# Patient Record
Sex: Female | Born: 1980 | Race: White | Hispanic: No | Marital: Married | Smoking: Never smoker
Health system: Southern US, Community
[De-identification: ages and names within clinical notes are randomized; demographics above are authoritative.]

## PROBLEM LIST (undated history)

## (undated) DIAGNOSIS — B019 Varicella without complication: Secondary | ICD-10-CM

## (undated) DIAGNOSIS — Z789 Other specified health status: Secondary | ICD-10-CM

## (undated) HISTORY — DX: Varicella without complication: B01.9

## (undated) HISTORY — PX: NO PAST SURGERIES: SHX2092

---

## 2020-04-08 DIAGNOSIS — Z03818 Encounter for observation for suspected exposure to other biological agents ruled out: Secondary | ICD-10-CM | POA: Diagnosis not present

## 2020-04-08 DIAGNOSIS — Z20828 Contact with and (suspected) exposure to other viral communicable diseases: Secondary | ICD-10-CM | POA: Diagnosis not present

## 2020-12-12 DIAGNOSIS — O009 Unspecified ectopic pregnancy without intrauterine pregnancy: Secondary | ICD-10-CM

## 2020-12-12 HISTORY — DX: Unspecified ectopic pregnancy without intrauterine pregnancy: O00.90

## 2021-05-30 ENCOUNTER — Emergency Department (HOSPITAL_COMMUNITY): Payer: BC Managed Care – PPO | Admitting: Anesthesiology

## 2021-05-30 ENCOUNTER — Ambulatory Visit (HOSPITAL_COMMUNITY)
Admission: EM | Admit: 2021-05-30 | Discharge: 2021-05-31 | Disposition: A | Discharge status: Home / Self Care | Payer: BC Managed Care – PPO | Attending: Obstetrics & Gynecology | Admitting: Obstetrics & Gynecology

## 2021-05-30 ENCOUNTER — Encounter (HOSPITAL_COMMUNITY)
Admission: EM | Disposition: A | Discharge status: Home / Self Care | Payer: Self-pay | Source: Home / Self Care | Attending: Emergency Medicine

## 2021-05-30 ENCOUNTER — Encounter (HOSPITAL_COMMUNITY): Payer: Self-pay

## 2021-05-30 ENCOUNTER — Other Ambulatory Visit: Payer: Self-pay

## 2021-05-30 DIAGNOSIS — Z8759 Personal history of other complications of pregnancy, childbirth and the puerperium: Secondary | ICD-10-CM | POA: Diagnosis not present

## 2021-05-30 DIAGNOSIS — K661 Hemoperitoneum: Secondary | ICD-10-CM | POA: Insufficient documentation

## 2021-05-30 DIAGNOSIS — R55 Syncope and collapse: Secondary | ICD-10-CM | POA: Diagnosis not present

## 2021-05-30 DIAGNOSIS — Z3A00 Weeks of gestation of pregnancy not specified: Secondary | ICD-10-CM | POA: Diagnosis not present

## 2021-05-30 DIAGNOSIS — Z791 Long term (current) use of non-steroidal anti-inflammatories (NSAID): Secondary | ICD-10-CM | POA: Insufficient documentation

## 2021-05-30 DIAGNOSIS — R578 Other shock: Secondary | ICD-10-CM | POA: Diagnosis present

## 2021-05-30 DIAGNOSIS — R404 Transient alteration of awareness: Secondary | ICD-10-CM | POA: Diagnosis not present

## 2021-05-30 DIAGNOSIS — O00109 Unspecified tubal pregnancy without intrauterine pregnancy: Secondary | ICD-10-CM | POA: Diagnosis not present

## 2021-05-30 DIAGNOSIS — O00102 Left tubal pregnancy without intrauterine pregnancy: Secondary | ICD-10-CM | POA: Insufficient documentation

## 2021-05-30 DIAGNOSIS — Z20822 Contact with and (suspected) exposure to covid-19: Secondary | ICD-10-CM | POA: Diagnosis not present

## 2021-05-30 DIAGNOSIS — O009 Unspecified ectopic pregnancy without intrauterine pregnancy: Secondary | ICD-10-CM

## 2021-05-30 DIAGNOSIS — R1084 Generalized abdominal pain: Secondary | ICD-10-CM | POA: Diagnosis not present

## 2021-05-30 DIAGNOSIS — R1030 Lower abdominal pain, unspecified: Secondary | ICD-10-CM | POA: Diagnosis not present

## 2021-05-30 HISTORY — PX: DIAGNOSTIC LAPAROSCOPY WITH REMOVAL OF ECTOPIC PREGNANCY: SHX6449

## 2021-05-30 HISTORY — DX: Other specified health status: Z78.9

## 2021-05-30 HISTORY — DX: Personal history of other complications of pregnancy, childbirth and the puerperium: Z87.59

## 2021-05-30 LAB — CBC WITH DIFFERENTIAL/PLATELET
Abs Immature Granulocytes: 0.12 10*3/uL — ABNORMAL HIGH (ref 0.00–0.07)
Basophils Absolute: 0.1 10*3/uL (ref 0.0–0.1)
Basophils Relative: 0 %
Eosinophils Absolute: 0.1 10*3/uL (ref 0.0–0.5)
Eosinophils Relative: 0 %
HCT: 28.1 % — ABNORMAL LOW (ref 36.0–46.0)
Hemoglobin: 8.7 g/dL — ABNORMAL LOW (ref 12.0–15.0)
Immature Granulocytes: 1 %
Lymphocytes Relative: 9 %
Lymphs Abs: 1.5 10*3/uL (ref 0.7–4.0)
MCH: 30.1 pg (ref 26.0–34.0)
MCHC: 31 g/dL (ref 30.0–36.0)
MCV: 97.2 fL (ref 80.0–100.0)
Monocytes Absolute: 0.6 10*3/uL (ref 0.1–1.0)
Monocytes Relative: 4 %
Neutro Abs: 15.5 10*3/uL — ABNORMAL HIGH (ref 1.7–7.7)
Neutrophils Relative %: 86 %
Platelets: 230 10*3/uL (ref 150–400)
RBC: 2.89 MIL/uL — ABNORMAL LOW (ref 3.87–5.11)
RDW: 13.1 % (ref 11.5–15.5)
WBC: 17.9 10*3/uL — ABNORMAL HIGH (ref 4.0–10.5)
nRBC: 0 % (ref 0.0–0.2)

## 2021-05-30 LAB — COMPREHENSIVE METABOLIC PANEL
ALT: 9 U/L (ref 0–44)
AST: 11 U/L — ABNORMAL LOW (ref 15–41)
Albumin: 2.7 g/dL — ABNORMAL LOW (ref 3.5–5.0)
Alkaline Phosphatase: 26 U/L — ABNORMAL LOW (ref 38–126)
Anion gap: 6 (ref 5–15)
BUN: 12 mg/dL (ref 6–20)
CO2: 21 mmol/L — ABNORMAL LOW (ref 22–32)
Calcium: 7.5 mg/dL — ABNORMAL LOW (ref 8.9–10.3)
Chloride: 109 mmol/L (ref 98–111)
Creatinine, Ser: 0.89 mg/dL (ref 0.44–1.00)
GFR, Estimated: >60 mL/min (ref >60)
Glucose, Bld: 181 mg/dL — ABNORMAL HIGH (ref 70–99)
Potassium: 3.7 mmol/L (ref 3.5–5.1)
Sodium: 136 mmol/L (ref 135–145)
Total Bilirubin: 0.8 mg/dL (ref 0.3–1.2)
Total Protein: 4.4 g/dL — ABNORMAL LOW (ref 6.5–8.1)

## 2021-05-30 LAB — POCT I-STAT EG7
Acid-base deficit: 6 mmol/L — ABNORMAL HIGH (ref 0.0–2.0)
Bicarbonate: 20.1 mmol/L (ref 20.0–28.0)
Calcium, Ion: 0.96 mmol/L — ABNORMAL LOW (ref 1.15–1.40)
HCT: 21 % — ABNORMAL LOW (ref 36.0–46.0)
Hemoglobin: 7.1 g/dL — ABNORMAL LOW (ref 12.0–15.0)
O2 Saturation: 100 %
Patient temperature: 36
Potassium: 5.4 mmol/L — ABNORMAL HIGH (ref 3.5–5.1)
Sodium: 137 mmol/L (ref 135–145)
TCO2: 21 mmol/L — ABNORMAL LOW (ref 22–32)
pCO2, Ven: 39.2 mmHg — ABNORMAL LOW (ref 44.0–60.0)
pH, Ven: 7.313 (ref 7.250–7.430)
pO2, Ven: 407 mmHg — ABNORMAL HIGH (ref 32.0–45.0)

## 2021-05-30 LAB — COMPREHENSIVE METABOLIC PANEL WITH GFR
ALT: 8 U/L (ref 0–44)
AST: 16 U/L (ref 15–41)
Albumin: 2.6 g/dL — ABNORMAL LOW (ref 3.5–5.0)
Alkaline Phosphatase: 25 U/L — ABNORMAL LOW (ref 38–126)
Anion gap: 6 (ref 5–15)
BUN: 13 mg/dL (ref 6–20)
CO2: 18 mmol/L — ABNORMAL LOW (ref 22–32)
Calcium: 7.5 mg/dL — ABNORMAL LOW (ref 8.9–10.3)
Chloride: 111 mmol/L (ref 98–111)
Creatinine, Ser: 0.76 mg/dL (ref 0.44–1.00)
GFR, Estimated: >60 mL/min
Glucose, Bld: 131 mg/dL — ABNORMAL HIGH (ref 70–99)
Potassium: 4.5 mmol/L (ref 3.5–5.1)
Sodium: 135 mmol/L (ref 135–145)
Total Bilirubin: 1.5 mg/dL — ABNORMAL HIGH (ref 0.3–1.2)
Total Protein: 4.1 g/dL — ABNORMAL LOW (ref 6.5–8.1)

## 2021-05-30 LAB — POCT I-STAT 7, (LYTES, BLD GAS, ICA,H+H)
Acid-base deficit: 5 mmol/L — ABNORMAL HIGH (ref 0.0–2.0)
Bicarbonate: 21.3 mmol/L (ref 20.0–28.0)
Calcium, Ion: 1.13 mmol/L — ABNORMAL LOW (ref 1.15–1.40)
HCT: 31 % — ABNORMAL LOW (ref 36.0–46.0)
Hemoglobin: 10.5 g/dL — ABNORMAL LOW (ref 12.0–15.0)
O2 Saturation: 100 %
Potassium: 5.8 mmol/L — ABNORMAL HIGH (ref 3.5–5.1)
Sodium: 136 mmol/L (ref 135–145)
TCO2: 23 mmol/L (ref 22–32)
pCO2 arterial: 46.4 mmHg (ref 32.0–48.0)
pH, Arterial: 7.27 — ABNORMAL LOW (ref 7.350–7.450)
pO2, Arterial: 547 mmHg — ABNORMAL HIGH (ref 83.0–108.0)

## 2021-05-30 LAB — GLUCOSE, CAPILLARY: Glucose-Capillary: 105 mg/dL — ABNORMAL HIGH (ref 70–99)

## 2021-05-30 LAB — I-STAT BETA HCG BLOOD, ED (MC, WL, AP ONLY): I-stat hCG, quantitative: >2000 m[IU]/mL — ABNORMAL HIGH (ref <5)

## 2021-05-30 LAB — RESP PANEL BY RT-PCR (FLU A&B, COVID) ARPGX2
Influenza A by PCR: NEGATIVE
Influenza B by PCR: NEGATIVE
SARS Coronavirus 2 by RT PCR: NEGATIVE

## 2021-05-30 LAB — CBC
HCT: 41.2 % (ref 36.0–46.0)
Hemoglobin: 13.3 g/dL (ref 12.0–15.0)
MCH: 30.1 pg (ref 26.0–34.0)
MCHC: 32.3 g/dL (ref 30.0–36.0)
MCV: 93.2 fL (ref 80.0–100.0)
Platelets: 142 K/uL — ABNORMAL LOW (ref 150–400)
RBC: 4.42 MIL/uL (ref 3.87–5.11)
RDW: 14.3 % (ref 11.5–15.5)
WBC: 22.4 K/uL — ABNORMAL HIGH (ref 4.0–10.5)
nRBC: 0 % (ref 0.0–0.2)

## 2021-05-30 LAB — PREPARE RBC (CROSSMATCH)

## 2021-05-30 LAB — ABO/RH: ABO/RH(D): A POS

## 2021-05-30 SURGERY — LAPAROSCOPY, WITH ECTOPIC PREGNANCY SURGICAL TREATMENT
Anesthesia: General

## 2021-05-30 MED ORDER — ROCURONIUM BROMIDE 10 MG/ML (PF) SYRINGE
PREFILLED_SYRINGE | INTRAVENOUS | Status: DC | PRN
  Administered 2021-05-30: 50 mg via INTRAVENOUS

## 2021-05-30 MED ORDER — FENTANYL CITRATE (PF) 100 MCG/2ML IJ SOLN
INTRAMUSCULAR | Status: DC | PRN
  Administered 2021-05-30: 50 ug via INTRAVENOUS

## 2021-05-30 MED ORDER — PHENYLEPHRINE HCL-NACL 10-0.9 MG/250ML-% IV SOLN
INTRAVENOUS | Status: DC | PRN
  Administered 2021-05-30: 100 ug/min via INTRAVENOUS

## 2021-05-30 MED ORDER — SODIUM CHLORIDE 0.9 % IV SOLN: INTRAVENOUS | Status: DC | PRN

## 2021-05-30 MED ORDER — SUCCINYLCHOLINE CHLORIDE 20 MG/ML IJ SOLN
INTRAMUSCULAR | Status: DC | PRN
  Administered 2021-05-30: 120 mg via INTRAVENOUS

## 2021-05-30 MED ORDER — 0.9 % SODIUM CHLORIDE (POUR BTL) OPTIME
TOPICAL | Status: DC | PRN
  Administered 2021-05-30: 1000 mL

## 2021-05-30 MED ORDER — MIDAZOLAM HCL 2 MG/2ML IJ SOLN
INTRAMUSCULAR | Status: AC
  Filled 2021-05-30: qty 2

## 2021-05-30 MED ORDER — SODIUM CHLORIDE 0.9 % IV SOLN
8.0000 mg | Freq: Four times a day (QID) | INTRAVENOUS | Status: DC | PRN
  Filled 2021-05-30: qty 4

## 2021-05-30 MED ORDER — OXYCODONE HCL 5 MG PO TABS
5.0000 mg | ORAL_TABLET | Freq: Once | ORAL | Status: AC | PRN
  Administered 2021-05-30: 5 mg via ORAL

## 2021-05-30 MED ORDER — OXYCODONE HCL 5 MG PO TABS
ORAL_TABLET | ORAL | Status: AC
  Filled 2021-05-30: qty 1

## 2021-05-30 MED ORDER — SUGAMMADEX SODIUM 200 MG/2ML IV SOLN
INTRAVENOUS | Status: DC | PRN
  Administered 2021-05-30: 200 mg via INTRAVENOUS

## 2021-05-30 MED ORDER — FENTANYL CITRATE (PF) 100 MCG/2ML IJ SOLN: 25.0000 ug | INTRAMUSCULAR | Status: DC | PRN

## 2021-05-30 MED ORDER — VASOPRESSIN 20 UNIT/ML IV SOLN
INTRAVENOUS | Status: DC | PRN
  Administered 2021-05-30 (×3): 2 [IU] via INTRAVENOUS

## 2021-05-30 MED ORDER — DEXTROSE IN LACTATED RINGERS 5 % IV SOLN: INTRAVENOUS | Status: DC

## 2021-05-30 MED ORDER — SODIUM CHLORIDE 0.9 % IV BOLUS
2000.0000 mL | Freq: Once | INTRAVENOUS | Status: AC
  Administered 2021-05-30: 2000 mL via INTRAVENOUS

## 2021-05-30 MED ORDER — LIDOCAINE 2% (20 MG/ML) 5 ML SYRINGE
INTRAMUSCULAR | Status: DC | PRN
  Administered 2021-05-30: 60 mg via INTRAVENOUS

## 2021-05-30 MED ORDER — BUPIVACAINE HCL (PF) 0.25 % IJ SOLN
INTRAMUSCULAR | Status: AC
  Filled 2021-05-30: qty 30

## 2021-05-30 MED ORDER — AMISULPRIDE (ANTIEMETIC) 5 MG/2ML IV SOLN
INTRAVENOUS | Status: AC
  Filled 2021-05-30: qty 4

## 2021-05-30 MED ORDER — PHENYLEPHRINE HCL (PRESSORS) 10 MG/ML IV SOLN
INTRAVENOUS | Status: DC | PRN
  Administered 2021-05-30: 240 ug via INTRAVENOUS
  Administered 2021-05-30: 160 ug via INTRAVENOUS

## 2021-05-30 MED ORDER — VASOPRESSIN 20 UNIT/ML IV SOLN
INTRAVENOUS | Status: AC
  Filled 2021-05-30: qty 1

## 2021-05-30 MED ORDER — MIDAZOLAM HCL 5 MG/5ML IJ SOLN
INTRAMUSCULAR | Status: DC | PRN
  Administered 2021-05-30: 2 mg via INTRAVENOUS

## 2021-05-30 MED ORDER — AMISULPRIDE (ANTIEMETIC) 5 MG/2ML IV SOLN
10.0000 mg | Freq: Once | INTRAVENOUS | Status: AC
  Administered 2021-05-30: 10 mg via INTRAVENOUS

## 2021-05-30 MED ORDER — ENOXAPARIN SODIUM 40 MG/0.4ML IJ SOSY: 40.0000 mg | PREFILLED_SYRINGE | INTRAMUSCULAR | Status: DC

## 2021-05-30 MED ORDER — PROPOFOL 10 MG/ML IV BOLUS
INTRAVENOUS | Status: DC | PRN
  Administered 2021-05-30: 100 mg via INTRAVENOUS

## 2021-05-30 MED ORDER — OXYCODONE HCL 5 MG PO TABS
5.0000 mg | ORAL_TABLET | ORAL | Status: DC | PRN
  Administered 2021-05-30 – 2021-05-31 (×4): 5 mg via ORAL
  Filled 2021-05-30 (×4): qty 1

## 2021-05-30 MED ORDER — FENTANYL CITRATE (PF) 250 MCG/5ML IJ SOLN
INTRAMUSCULAR | Status: AC
  Filled 2021-05-30: qty 5

## 2021-05-30 MED ORDER — OXYCODONE HCL 5 MG/5ML PO SOLN: 5.0000 mg | Freq: Once | ORAL | Status: AC | PRN

## 2021-05-30 MED ORDER — SODIUM CHLORIDE 0.9 % IV BOLUS: 1000.0000 mL | Freq: Once | INTRAVENOUS | Status: DC

## 2021-05-30 MED ORDER — SODIUM CHLORIDE 0.9% IV SOLUTION: Freq: Once | INTRAVENOUS | Status: DC

## 2021-05-30 MED ORDER — PROPOFOL 10 MG/ML IV BOLUS
INTRAVENOUS | Status: AC
  Filled 2021-05-30: qty 20

## 2021-05-30 MED ORDER — CALCIUM CHLORIDE 10 % IV SOLN
INTRAVENOUS | Status: DC | PRN
  Administered 2021-05-30: 100 mg via INTRAVENOUS
  Administered 2021-05-30: 200 mg via INTRAVENOUS
  Administered 2021-05-30: 100 mg via INTRAVENOUS

## 2021-05-30 MED ORDER — OXYCODONE HCL 5 MG PO TABS
5.0000 mg | ORAL_TABLET | Freq: Four times a day (QID) | ORAL | 0 refills | Status: DC | PRN
Start: 2021-05-30 — End: 2022-01-19

## 2021-05-30 MED ORDER — DEXAMETHASONE SODIUM PHOSPHATE 10 MG/ML IJ SOLN
INTRAMUSCULAR | Status: DC | PRN
  Administered 2021-05-30: 10 mg via INTRAVENOUS

## 2021-05-30 MED ORDER — PROMETHAZINE HCL 25 MG/ML IJ SOLN: 6.2500 mg | INTRAMUSCULAR | Status: DC | PRN

## 2021-05-30 MED ORDER — SODIUM CHLORIDE 0.9 % IR SOLN
Status: DC | PRN
  Administered 2021-05-30: 1000 mL

## 2021-05-30 MED ORDER — ONDANSETRON HCL 4 MG PO TABS: 8.0000 mg | ORAL_TABLET | Freq: Four times a day (QID) | ORAL | Status: DC | PRN

## 2021-05-30 MED ORDER — KETOROLAC TROMETHAMINE 10 MG PO TABS
10.0000 mg | ORAL_TABLET | Freq: Three times a day (TID) | ORAL | Status: DC
  Administered 2021-05-30 – 2021-05-31 (×2): 10 mg via ORAL
  Filled 2021-05-30 (×4): qty 1

## 2021-05-30 MED ORDER — ONDANSETRON HCL 4 MG/2ML IJ SOLN
INTRAMUSCULAR | Status: DC | PRN
  Administered 2021-05-30: 4 mg via INTRAVENOUS

## 2021-05-30 MED ORDER — LACTATED RINGERS IV SOLN: INTRAVENOUS | Status: DC | PRN

## 2021-05-30 MED ORDER — BUPIVACAINE HCL (PF) 0.25 % IJ SOLN
INTRAMUSCULAR | Status: DC | PRN
  Administered 2021-05-30: 10 mL

## 2021-05-30 SURGICAL SUPPLY — 28 items
APPLICATOR COTTON TIP 6 STRL (MISCELLANEOUS) ×1 IMPLANT
APPLICATOR COTTON TIP 6IN STRL (MISCELLANEOUS) ×3 IMPLANT
BLADE SURG 15 STRL LF DISP TIS (BLADE) ×1 IMPLANT
BLADE SURG 15 STRL SS (BLADE) ×2
DERMABOND ADVANCED (GAUZE/BANDAGES/DRESSINGS) ×2
DERMABOND ADVANCED .7 DNX12 (GAUZE/BANDAGES/DRESSINGS) ×1 IMPLANT
DURAPREP 26ML APPLICATOR (WOUND CARE) ×3 IMPLANT
GLOVE ECLIPSE 7.0 STRL STRAW (GLOVE) ×3 IMPLANT
GLOVE SURG UNDER POLY LF SZ7 (GLOVE) ×12 IMPLANT
GOWN STRL REUS W/ TWL LRG LVL3 (GOWN DISPOSABLE) ×3 IMPLANT
GOWN STRL REUS W/TWL LRG LVL3 (GOWN DISPOSABLE) ×6
KIT TURNOVER KIT B (KITS) ×3 IMPLANT
PACK LAPAROSCOPY BASIN (CUSTOM PROCEDURE TRAY) ×3 IMPLANT
PACK TRENDGUARD 450 HYBRID PRO (MISCELLANEOUS) ×1 IMPLANT
POUCH SPECIMEN RETRIEVAL 10MM (ENDOMECHANICALS) ×3 IMPLANT
PROTECTOR NERVE ULNAR (MISCELLANEOUS) ×6 IMPLANT
SET IRRIG TUBING LAPAROSCOPIC (IRRIGATION / IRRIGATOR) ×3 IMPLANT
SET TUBE SMOKE EVAC HIGH FLOW (TUBING) ×3 IMPLANT
SHEARS HARMONIC ACE PLUS 36CM (ENDOMECHANICALS) ×3 IMPLANT
SLEEVE ENDOPATH XCEL 5M (ENDOMECHANICALS) ×3 IMPLANT
SUT VIC AB 3-0 X1 27 (SUTURE) ×3 IMPLANT
SUT VICRYL 0 UR6 27IN ABS (SUTURE) ×3 IMPLANT
TOWEL GREEN STERILE FF (TOWEL DISPOSABLE) ×6 IMPLANT
TRAY FOLEY W/BAG SLVR 14FR (SET/KITS/TRAYS/PACK) ×3 IMPLANT
TRENDGUARD 450 HYBRID PRO PACK (MISCELLANEOUS) ×3
TROCAR BALLN 12MMX100 BLUNT (TROCAR) ×3 IMPLANT
TROCAR XCEL NON-BLD 11X100MML (ENDOMECHANICALS) ×3 IMPLANT
TROCAR XCEL NON-BLD 5MMX100MML (ENDOMECHANICALS) ×3 IMPLANT

## 2021-05-30 NOTE — Anesthesia Procedure Notes (Signed)
Arterial Line Insertion Start/End6/19/2022 2:10 PM, 05/30/2021 2:18 PM Performed by: Beryle Lathe, MD, anesthesiologist  Patient location: OR. Preanesthetic checklist: patient identified, IV checked, risks and benefits discussed, surgical consent, monitors and equipment checked, pre-op evaluation, timeout performed and anesthesia consent Emergency situation Patient sedated Right, radial was placed Catheter size: 20 G Hand hygiene performed   Attempts: 3 (Previous attempts by CRNA unsuccessful) Procedure performed using ultrasound guided technique. Ultrasound Notes:anatomy identified, needle tip was noted to be adjacent to the nerve/plexus identified, no ultrasound evidence of intravascular and/or intraneural injection and image(s) printed for medical record Following insertion, dressing applied and Biopatch. Post procedure assessment: unchanged and normal  Patient tolerated the procedure well with no immediate complications.

## 2021-05-30 NOTE — Progress Notes (Signed)
Orthopedic Tech Progress Note Patient Details:  Charlotte Moore 10-27-1981 [id number]   Ortho Devices Type of Ortho Device: Knee Immobilizer Ortho Device/Splint Interventions: Ordered   Post Interventions Instructions Provided: Other (comment) (Dropped off at OR desk)  Docia Furl 05/30/2021, 2:33 PM

## 2021-05-30 NOTE — Progress Notes (Signed)
Assumed care of patient from Charlotte Moore. Vernona Rieger has placed second call to Dr. Shawnie Pons to discuss pt's dizziness and shortness of breath. Meanwhile, I have started pts remaining LR on an IV pump to finish that bag. Pt is alert, oriented, denies pain, is eating and drinking without nausea. When this liter of LR finishes we will attempt to get her up again and see if her bp and O2 sats hold. 05/30/2021 @ 1942 Manson Allan, RN

## 2021-05-30 NOTE — Progress Notes (Signed)
Mal Amabile MD requested a STAT complete metabolic panel to be drawn. Patient's potassium was 5.8 post blood transfusions. Waiting on complete metabolic panel and CBC lab work to result per MD order. Both labs were drawn. When trying to get the patient up to go to the bathroom the patient stated she was dizzy and felt like she might faint. Patient laid back down and began to feel better. Second attempt to get the patient up was made and the patient felt like she was going to faint. Patient was laid back down and patient was concerned about being discharged. MD Shawnie Pons was notified, Shawnie Pons wants an updated CBC. Will continue to monitor.

## 2021-05-30 NOTE — H&P (Addendum)
Charlotte Moore is an 40 y.o. G2P1001 female.   Chief Complaint: Fainted HPI: Reports LMP at end of May and normal. Passed out today at home. Brought in by EMS. 2 large bore IVs. Found to have HCG > 2000 and abdomen with blood around liver no IUP on bedside u/s c/w ruptured ectopic pregnancy.  Past Medical History:  Diagnosis Date   No pertinent past medical history     Past Surgical History:  Procedure Laterality Date   NO PAST SURGERIES      History reviewed. No pertinent family history. Social History:  reports that she has never smoked. She has never used smokeless tobacco. She reports current alcohol use. She reports that she does not use drugs.  Allergies: No Known Allergies   Review of systems not obtained due to patient factors. Too unstable.  Did say no s/sx's of COVID recently.  Blood pressure (!) 54/26, pulse 71, temperature (!) 97.5 F (36.4 C), temperature source Oral, resp. rate 20, height 5\' 5"  (1.651 m), weight 65.8 kg, last menstrual period 05/05/2021, SpO2 100 %. General appearance: alert, cooperative, appears stated age, and pale Head: Normocephalic, without obvious abnormality, atraumatic Neck: supple, symmetrical, trachea midline Lungs:  normal effort Heart: regular rate and rhythm Abdomen:  soft, diffusely tender Extremities: Homans sign is negative, no sign of DVT Skin:  Pale, cool Neurologic: Grossly normal   No results found for: WBC, HGB, HCT, MCV, PLT Lab Results  Component Value Date   HCG >2,000.0 (H) 05/30/2021   Results for orders placed or performed during the hospital encounter of 05/30/21 (from the past 24 hour(s))  Type and screen     Status: None (Preliminary result)   Collection Time: 05/30/21  1:04 PM  Result Value Ref Range   ABO/RH(D) A POS    Antibody Screen PENDING    Sample Expiration 06/02/2021,2359    Unit Number 06/04/2021    Blood Component Type RBC LR PHER1    Unit division 00    Status of Unit ALLOCATED    Unit tag  comment VERBAL ORDERS PER DR    Transfusion Status OK TO TRANSFUSE    Crossmatch Result COMPATIBLE   CBC with Differential/Platelet     Status: Abnormal   Collection Time: 05/30/21  1:04 PM  Result Value Ref Range   WBC 17.9 (H) 4.0 - 10.5 K/uL   RBC 2.89 (L) 3.87 - 5.11 MIL/uL   Hemoglobin 8.7 (L) 12.0 - 15.0 g/dL   HCT 06/01/21 (L) 09.3 - 23.5 %   MCV 97.2 80.0 - 100.0 fL   MCH 30.1 26.0 - 34.0 pg   MCHC 31.0 30.0 - 36.0 g/dL   RDW 57.3 22.0 - 25.4 %   Platelets 230 150 - 400 K/uL   nRBC 0.0 0.0 - 0.2 %   Neutrophils Relative % 86 %   Neutro Abs 15.5 (H) 1.7 - 7.7 K/uL   Lymphocytes Relative 9 %   Lymphs Abs 1.5 0.7 - 4.0 K/uL   Monocytes Relative 4 %   Monocytes Absolute 0.6 0.1 - 1.0 K/uL   Eosinophils Relative 0 %   Eosinophils Absolute 0.1 0.0 - 0.5 K/uL   Basophils Relative 0 %   Basophils Absolute 0.1 0.0 - 0.1 K/uL   Immature Granulocytes 1 %   Abs Immature Granulocytes 0.12 (H) 0.00 - 0.07 K/uL  I-Stat Beta hCG blood, ED ([facility], [facility], [facility] only)     Status: Abnormal   Collection Time: 05/30/21  1:14 PM  Result  Value Ref Range   I-stat hCG, quantitative >2,000.0 (H) <5 mIU/mL   Comment 3          Prepare RBC (crossmatch)     Status: None   Collection Time: 05/30/21  1:15 PM  Result Value Ref Range   Order Confirmation      ORDER PROCESSED BY BLOOD BANK Performed at [facility], [address], [place], [place] [address]      Assessment/Plan Principal Problem:   Hemoperitoneum due to rupture of tubal ectopic pregnancy Active Problems:   Hemorrhagic shock (HCC)  For laparoscopic removal Given 1 unit and 3L of crystalloid to stabilize BP.  Risks include but are not limited to bleeding, infection, injury to surrounding structures, including bowel, bladder and ureters, blood clots, and death.  Likelihood of success is high.    Charlotte Moore 05/30/2021, 1:30 PM

## 2021-05-30 NOTE — ED Triage Notes (Signed)
Pt arrived to ED via EMS from church were pt had near syncopal episode after experiencing lower abd pain. Upon EMS arrival pt had syncopal episode on EMS stretcher. Pt moved to ED stretcher and hooked up to monitor. Episode lasted about 2 minutes. Pt reports having increased pain prior to syncopal episode. EMS reports pt c/o 5/10 LLQ pain and 2/10 RLQ pain. EMS reports similar episode happened a few weeks ago which was transient and pt never sought treatment. Pt noted to be hypotensive w/ EMS. EMS placed 20 L AC and gave NS bolus. 4mg  zofran given. BP 86/47, CBG 156, temp 96.5.

## 2021-05-30 NOTE — Anesthesia Procedure Notes (Signed)
Procedure Name: Intubation Date/Time: 05/30/2021 1:54 PM Performed by: Gwenyth Allegra, CRNA Pre-anesthesia Checklist: Patient identified, Emergency Drugs available, Suction available, Patient being monitored and Timeout performed Patient Re-evaluated:Patient Re-evaluated prior to induction Oxygen Delivery Method: Circle system utilized Preoxygenation: Pre-oxygenation with 100% oxygen Induction Type: IV induction, Rapid sequence and Cricoid Pressure applied Grade View: Grade I Tube type: Oral Tube size: 7.0 mm Number of attempts: 1 Airway Equipment and Method: Stylet Placement Confirmation: ETT inserted through vocal cords under direct vision, positive ETCO2 and breath sounds checked- equal and bilateral Secured at: 21 cm Tube secured with: Tape Dental Injury: Teeth and Oropharynx as per pre-operative assessment

## 2021-05-30 NOTE — Anesthesia Preprocedure Evaluation (Addendum)
Anesthesia Evaluation  Patient identified by MRN, date of birth, ID band Patient awake    Reviewed: Allergy & Precautions, NPO status , Patient's Chart, lab work & pertinent test resultsPreop documentation limited or incomplete due to emergent nature of procedure.  History of Anesthesia Complications Negative for: history of anesthetic complications  Airway Mallampati: I  TM Distance: >3 FB Neck ROM: Full    Dental  (+) Dental Advisory Given, Chipped,    Pulmonary neg pulmonary ROS,    Pulmonary exam normal        Cardiovascular negative cardio ROS Normal cardiovascular exam     Neuro/Psych negative neurological ROS  negative psych ROS   GI/Hepatic negative GI ROS, Neg liver ROS,   Endo/Other  negative endocrine ROS  Renal/GU negative Renal ROS     Musculoskeletal negative musculoskeletal ROS (+)   Abdominal   Peds  Hematology negative hematology ROS (+)   Anesthesia Other Findings   Reproductive/Obstetrics  Ectopic pregnancy                             Anesthesia Physical Anesthesia Plan  ASA: 1 and emergent  Anesthesia Plan: General   Post-op Pain Management:    Induction: Intravenous and Rapid sequence  PONV Risk Score and Plan: 4 or greater and Treatment may vary due to age or medical condition, Ondansetron, Midazolam, Dexamethasone and Scopolamine patch - Pre-op  Airway Management Planned: Oral ETT  Additional Equipment: Arterial line  Intra-op Plan:   Post-operative Plan: Extubation in OR  Informed Consent: I have reviewed the patients History and Physical, chart, labs and discussed the procedure including the risks, benefits and alternatives for the proposed anesthesia with the patient or authorized representative who has indicated his/her understanding and acceptance.     Dental advisory given and Only emergency history available  Plan Discussed with: CRNA and  Anesthesiologist  Anesthesia Plan Comments:        Anesthesia Quick Evaluation

## 2021-05-30 NOTE — ED Provider Notes (Signed)
[facility] EMERGENCY DEPARTMENT Provider Note   CSN: [id number] Arrival date & time: 05/30/21  1245     History Chief Complaint  Patient presents with   Loss of Consciousness    Charlotte Moore is a 40 y.o. female.   Loss of Consciousness  This patient is a 40 year old female, she denies any chronic medical problems and takes no daily medicines.  She is G18 with a 50-year-old child, she states that she was at church this morning when she had acute onset of lower abdominal pain.  She went to the bathroom and had a near syncopal event.  She states in the past she had had low blood sugar in the past which is caused her to have near syncope similar to this but had a normal breakfast this morning.  Paramedics found her blood sugar to be normal at 150.  She then had a full syncopal episode when they got her onto the stretcher, she was hypotensive, tachycardic, weak radial pulses and significant tenderness to the abdomen  She is sexually active, she has not missed any periods  History reviewed. No pertinent past medical history.  There are no problems to display for this patient.   History reviewed. No pertinent surgical history.   OB History   No obstetric history on file.    Obstetric Comments  Pt reports having 1 pregnancy w/ 1 living child, without any complications.         History reviewed. No pertinent family history.  Social History   Tobacco Use   Smoking status: Never   Smokeless tobacco: Never  Vaping Use   Vaping Use: Never used  Substance Use Topics   Alcohol use: Yes   Drug use: Never    Home Medications Prior to Admission medications   Not on File    Allergies    Patient has no known allergies.  Review of Systems   Review of Systems  Cardiovascular:  Positive for syncope.  All other systems reviewed and are negative.  Physical Exam Updated Vital Signs BP (!) 54/26 Comment: MD at bedside  Pulse 71 Comment: MD at bedside   Temp (!) 97.5 F (36.4 C) (Oral)   Resp 20 Comment: MD at bedside  Ht 1.651 m (5\' 5" )   Wt 65.8 kg   LMP 05/05/2021   SpO2 100% Comment: MD at bedside  BMI 24.13 kg/m   Physical Exam Vitals and nursing note reviewed.  Constitutional:      General: She is not in acute distress.    Appearance: She is well-developed. She is ill-appearing and diaphoretic.  HENT:     Head: Normocephalic and atraumatic.     Mouth/Throat:     Pharynx: No oropharyngeal exudate.  Eyes:     General: No scleral icterus.       Right eye: No discharge.        Left eye: No discharge.     Conjunctiva/sclera: Conjunctivae normal.     Pupils: Pupils are equal, round, and reactive to light.  Neck:     Thyroid: No thyromegaly.     Vascular: No JVD.  Cardiovascular:     Rate and Rhythm: Regular rhythm. Tachycardia present.     Moore sounds: Normal Moore sounds. No murmur heard.   No friction rub. No gallop.  Pulmonary:     Effort: Pulmonary effort is normal. No respiratory distress.     Breath sounds: Normal breath sounds. No wheezing or rales.  Abdominal:  General: Bowel sounds are normal. There is no distension.     Palpations: Abdomen is soft. There is no mass.     Tenderness: There is abdominal tenderness.  Musculoskeletal:        General: No tenderness. Normal range of motion.     Cervical back: Normal range of motion and neck supple.  Lymphadenopathy:     Cervical: No cervical adenopathy.  Skin:    General: Skin is warm.     Findings: No erythema or rash.  Neurological:     Mental Status: She is alert.     Coordination: Coordination normal.  Psychiatric:        Behavior: Behavior normal.    ED Results / Procedures / Treatments   Labs (all labs ordered are listed, but only abnormal results are displayed) Labs Reviewed  RESP PANEL BY RT-PCR (FLU A&B, COVID) [id number]  CBC WITH DIFFERENTIAL/PLATELET  COMPREHENSIVE METABOLIC PANEL  URINALYSIS, ROUTINE W REFLEX MICROSCOPIC  I-STAT BETA HCG  BLOOD, ED ([facility], [facility], [facility] ONLY)  TYPE AND SCREEN  ABO/RH  PREPARE RBC (CROSSMATCH)    EKG None  Radiology No results found.  Procedures .Critical Care  Date/Time: 05/30/2021 1:09 PM Performed by: Charlotte Hong, MD Authorized by: Charlotte Hong, MD   Critical care provider statement:    Critical care time (minutes):  35   Critical care time was exclusive of:  Separately billable procedures and treating other patients and teaching time   Critical care was necessary to treat or prevent imminent or life-threatening deterioration of the following conditions:  Shock   Critical care was time spent personally by me on the following activities:  Blood draw for specimens, development of treatment plan with patient or surrogate, discussions with consultants, evaluation of patient's response to treatment, examination of patient, obtaining history from patient or surrogate, ordering and performing treatments and interventions, ordering and review of laboratory studies, ordering and review of radiographic studies, pulse oximetry, re-evaluation of patient's condition and review of old charts   Medications Ordered in ED Medications  sodium chloride 0.9 % bolus 1,000 mL (1,000 mLs Intravenous Not Given 05/30/21 1313)  0.9 %  sodium chloride infusion (Manually program via Guardrails IV Fluids) (has no administration in time range)  sodium chloride 0.9 % bolus 2,000 mL (2,000 mLs Intravenous New Bag/Given 05/30/21 1314)    ED Course  I have reviewed the triage vital signs and the nursing notes.  Pertinent labs & imaging results that were available during my care of the patient were reviewed by me and considered in my medical decision making (see chart for details).    MDM Rules/Calculators/A&P                           The constellation of symptoms including a syncopal episode following acute onset of abdominal pain, hypotension and my bedside ultrasound which shows free fluid in the abdomen is all  suggestive of a ruptured ectopic pregnancy in this patient with no prior abdominal surgical history.  She denies a history of PID, STDs, tubal ligation, no prior tubal pregnancy.  She has a diffusely tender peritoneal abdomen.  She is hypotensive, I have ordered for 2 IVs, fluid bolus, type and screen, ABO Rh, CBC, metabolic panel and an i-STAT hCG.  I called GYN and consulted with the nurse practitioner, they will have the attending OB/GYN come see the patient.  She is getting an IV fluid bolus at this time and is  critically ill.  Getting blood transfusion Bolus X 2 liters Still hypotensive OBGYN, MD at bedside -appreciate their rapid assessment.  Patient going to the OR at this time  Final Clinical Impression(s) / ED Diagnoses Final diagnoses:  Ruptured ectopic pregnancy     Charlotte Hong, MD 05/30/21 1316

## 2021-05-30 NOTE — Transfer of Care (Signed)
Immediate Anesthesia Transfer of Care Note  Patient: Charlotte Moore  Procedure(s) Performed: DIAGNOSTIC LAPAROSCOPY WITH REMOVAL OF ECTOPIC PREGNANCY  Patient Location: PACU  Anesthesia Type:General  Level of Consciousness: awake, alert  and oriented  Airway & Oxygen Therapy: Patient Spontanous Breathing and Patient connected to nasal cannula oxygen  Post-op Assessment: Report given to RN and Post -op Vital signs reviewed and stable  Post vital signs: Reviewed and stable  Last Vitals:  Vitals Value Taken Time  BP 182/168 05/30/21 1526  Temp    Pulse 75 05/30/21 1526  Resp 15 05/30/21 1527  SpO2 100 % 05/30/21 1526  Vitals shown include unvalidated device data.  Last Pain:  Vitals:   05/30/21 1315  TempSrc: Oral  PainSc:          Complications: No notable events documented.

## 2021-05-30 NOTE — Progress Notes (Signed)
Shawnie Pons MD was called to update about CBC which is 13.3 and Brock MD was notified of Potassium level at 4.5. Patient was nauseous so Barhemsys 10 mg was given per Mal Amabile MD verbal order. Tried to stand patient up and patient became hypotensive and felt like she was going to faint again. Tried to call Shawnie Pons MD to update but MD was unavailable, left a call back number. Patient's blood pressure returns to 112 systolic after laying back down. When standing up systolic is 88. Will continue to monitor and waiting for MD to call back.

## 2021-05-30 NOTE — Op Note (Signed)
PREOPERATIVE DIAGNOSIS: Probable ruptured ectopic pregnancy  POSTOPERATIVE DIAGNOSIS: Same  PROCEDURE: Laparoscopic left salpingectomy   INDICATIONS: 40 y.o. G2P1011 at Unknown here for with ruptured ectopic pregnancy with blood type A pos. She was seen emergently in The ED and was quite unstable and taken emergently to the ED. Patient was counseled regarding need for laparoscopic salpingectomy. Risks of surgery including bleeding which may require transfusion or reoperation, infection, injury to bowel or other surrounding organs, need for additional procedures including laparotomy and other postoperative/anesthesia complications were explained to patient.  Written informed consent was obtained  FINDINGS: large amount of hemoperitoneum estimated to be about 2000 cc of blood and clots.  Dilated left fallopian tube containing ectopic gestation. Small normal appearing uterus, normal right fallopian tube, right ovary and left ovary.  ANESTHESIA: General  SPECIMENS: left fallopian tube to pathology  COMPLICATIONS: None immediate  BLOOD PRODUCTS: S/P 4 U PRBCs, Albumin and crytalloid  PROCEDURE IN DETAIL:  The patient was taken to the operating room where general anesthesia was administered.  She was placed in the dorsal lithotomy position, and was prepped and draped in a sterile manner.  A Foley catheter was inserted into her bladder and attached to constant drainage and a uterine manipulator was then advanced into the uterus .  After an adequate timeout was performed, attention was then turned to the patient's abdomen where a 10-mm skin incision was made in the umbilical fold. Fascia and peritoneum were entered sharply.  A 0 Vicryl suture was used to tag the fascia circumferentially.  A Hassan trocar was placed. The laparoscope was introduced.  A survey of the patient's pelvis and abdomen revealed the findings as above.  Two left lower quadrant ports were placed under direct visualization; 5-mm x 2.   Attention was then turned to the left fallopian tube which was grasped and ligated from the underlying mesosalpinx and uterine attachment using the Harmonic instrument.  Good hemostasis was noted.  The specimen was placed in an EndoCatch bag and removed from the abdomen intact. Clot and blood removed with the Nezjhat. The abdomen was desufflated, and all instruments were removed.  The umbilicus incision was closed with the afore mentioned Vicryl suture; and all skin incisions were closed with a 3-0 Vicryl subcuticular stitch followed by DermaBond. The patient tolerated the procedure well.  All instruments, needles, and sponge counts were correct x 2. The patient was taken to the recovery room in stable condition.   Reva Bores MD 05/30/2021 3:28 PM

## 2021-05-30 NOTE — Progress Notes (Signed)
Patient symptomatic with hypotension 78/40 despite fluids and blood products. Spoke with Dr. Shawnie Pons for possible admission overnight. Dr. Despina Hidden to bedside in PACU for assessment and admission.

## 2021-05-30 NOTE — ED Notes (Signed)
Consent for procedure obtained and placed in chart 

## 2021-05-30 NOTE — Anesthesia Postprocedure Evaluation (Signed)
Anesthesia Post Note  Patient: Charlotte Moore  Procedure(s) Performed: DIAGNOSTIC LAPAROSCOPY WITH REMOVAL OF ECTOPIC PREGNANCY     Patient location during evaluation: PACU Anesthesia Type: General Level of consciousness: awake and alert Pain management: pain level controlled Vital Signs Assessment: post-procedure vital signs reviewed and stable Respiratory status: spontaneous breathing, nonlabored ventilation and respiratory function stable Cardiovascular status: blood pressure returned to baseline and stable Postop Assessment: no apparent nausea or vomiting Anesthetic complications: no   No notable events documented.  Last Vitals:  Vitals:   05/30/21 1556 05/30/21 1619  BP: (!) 98/55 (!) 102/43  Pulse: 62   Resp: 18   Temp: 36.5 C   SpO2: 100%     Last Pain:  Vitals:   05/30/21 1556  TempSrc:   PainSc: 0-No pain                 Beryle Lathe

## 2021-05-31 ENCOUNTER — Encounter (HOSPITAL_COMMUNITY): Payer: Self-pay | Admitting: Family Medicine

## 2021-05-31 LAB — CBC
HCT: 30.4 % — ABNORMAL LOW (ref 36.0–46.0)
Hemoglobin: 10.4 g/dL — ABNORMAL LOW (ref 12.0–15.0)
MCH: 29.7 pg (ref 26.0–34.0)
MCHC: 34.2 g/dL (ref 30.0–36.0)
MCV: 86.9 fL (ref 80.0–100.0)
Platelets: 154 10*3/uL (ref 150–400)
RBC: 3.5 MIL/uL — ABNORMAL LOW (ref 3.87–5.11)
RDW: 15.5 % (ref 11.5–15.5)
WBC: 14.7 10*3/uL — ABNORMAL HIGH (ref 4.0–10.5)
nRBC: 0 % (ref 0.0–0.2)

## 2021-05-31 MED ORDER — KETOROLAC TROMETHAMINE 10 MG PO TABS: 10.0000 mg | ORAL_TABLET | Freq: Three times a day (TID) | ORAL | 0 refills | Status: DC

## 2021-05-31 NOTE — Progress Notes (Signed)
D/C instructions given, all questions answered, and pt verbalized understanding. Pt aware of upcoming appointment. Pt is alert and oriented x4 and ambulatory.

## 2021-05-31 NOTE — Plan of Care (Signed)

## 2021-05-31 NOTE — Discharge Summary (Signed)
Physician Discharge Summary  Patient ID: Charlotte Moore MRN: [id number] DOB/AGE: [birth year] 39 y.o.  Admit date: 05/30/2021 Discharge date: 05/31/2021  Admission Diagnoses:S/P laparoscopic management of a ruptured ectopic with significant hemoperitoneum  Discharge Diagnoses:  Principal Problem:   Hemoperitoneum due to rupture of tubal ectopic pregnancy Active Problems:   Hemorrhagic shock (HCC)   S/P ectopic pregnancy   Discharged Condition: good  Hospital Course: admitted for post surgical fluid resuscitation, responded well, discharged am   Consults: None  Significant Diagnostic Studies: labs:   CBC CBC Latest Ref Rng & Units 05/31/2021 05/30/2021 05/30/2021  WBC 4.0 - 10.5 K/uL 14.7(H) 22.4(H) -  Hemoglobin 12.0 - 15.0 g/dL 10.4(L) 13.3 10.5(L)  Hematocrit 36.0 - 46.0 % 30.4(L) 41.2 31.0(L)  Platelets 150 - 400 K/uL 154 142(L) -      Treatments: IV hydration  Discharge Exam: Blood pressure (!) 106/47, pulse 89, temperature 98.3 F (36.8 C), temperature source Oral, resp. rate 17, height 5\' 5"  (1.651 m), weight 65.8 kg, last menstrual period 05/05/2021, SpO2 100 %. General appearance: alert, cooperative, and no distress GI: soft, non-tender; bowel sounds normal; no masses,  no organomegaly Incision/Wound:clean dry intact  Disposition: Discharge disposition: 01-Home or Self Care       Discharge Instructions      Remove dressing in 72 hours   Complete by: As directed    Call MD for:  persistant nausea and vomiting   Complete by: As directed    Call MD for:  persistant nausea and vomiting   Complete by: As directed    Call MD for:  redness, tenderness, or signs of infection (pain, swelling, redness, odor or green/yellow discharge around incision site)   Complete by: As directed    Call MD for:  severe uncontrolled pain   Complete by: As directed    Call MD for:  severe uncontrolled pain   Complete by: As directed    Call MD for:  temperature >100.4   Complete  by: As directed    Call MD for:  temperature >100.4   Complete by: As directed    Diet - low sodium heart healthy   Complete by: As directed    Diet - low sodium heart healthy   Complete by: As directed    Discharge wound care:   Complete by: As directed    Keep clean and dry   Driving Restrictions   Complete by: As directed    None while taking narcotic pain meds   Driving Restrictions   Complete by: As directed    No driving for 2 days   Increase activity slowly   Complete by: As directed    Increase activity slowly   Complete by: As directed    Lifting restrictions   Complete by: As directed    Nothing > 20 lbs x 2 weeks   Lifting restrictions   Complete by: As directed    Do not lift more than 10 pounds for 2 weeks   Sexual Activity Restrictions   Complete by: As directed    None x 2 weeks   Sexual Activity Restrictions   Complete by: As directed    No sex for 4 weeks      Allergies as of 05/31/2021   No Known Allergies      Medication List     TAKE these medications    ketorolac 10 MG tablet Commonly known as: TORADOL Take 1 tablet (10 mg total) by mouth every 8 (eight) hours.  oxyCODONE 5 MG immediate release tablet Commonly known as: Oxy IR/ROXICODONE Take 1 tablet (5 mg total) by mouth every 6 (six) hours as needed for severe pain.               Discharge Care Instructions  (From admission, onward)           Start     Ordered   05/31/21 0000  Discharge wound care:       Comments: Keep clean and dry   05/31/21 0854            Follow-up Information     [facility] Follow up in 2 week(s).   Specialty: Obstetrics and Gynecology Why: they will call you with an appointment Contact information: [address] [place] [address] [phone]                Signed: Lazaro Arms 05/31/2021, 8:59 AM

## 2021-06-01 LAB — BPAM RBC
Blood Product Expiration Date: 202206232359
Blood Product Expiration Date: 202206232359
Blood Product Expiration Date: 202206242359
Blood Product Expiration Date: 202206252359
Blood Product Expiration Date: 202206262359
Blood Product Expiration Date: 202206292359
Blood Product Expiration Date: 202206302359
Blood Product Expiration Date: 202207112359
Blood Product Expiration Date: 202207112359
ISSUE DATE / TIME: 202206191338
ISSUE DATE / TIME: 202206191338
ISSUE DATE / TIME: 202206191338
ISSUE DATE / TIME: 202206191431
ISSUE DATE / TIME: 202206191431
ISSUE DATE / TIME: 202206191440
ISSUE DATE / TIME: 202206191440
ISSUE DATE / TIME: 202206210127
ISSUE DATE / TIME: 202206210720
Unit Type and Rh: 6200
Unit Type and Rh: 6200
Unit Type and Rh: 9500
Unit Type and Rh: 9500
Unit Type and Rh: 9500
Unit Type and Rh: 9500
Unit Type and Rh: 9500
Unit Type and Rh: 9500
Unit Type and Rh: 9500

## 2021-06-01 LAB — TYPE AND SCREEN
ABO/RH(D): A POS
Antibody Screen: NEGATIVE
Unit division: 0
Unit division: 0
Unit division: 0
Unit division: 0
Unit division: 0
Unit division: 0
Unit division: 0
Unit division: 0
Unit division: 0

## 2021-06-01 LAB — SURGICAL PATHOLOGY

## 2021-06-04 ENCOUNTER — Telehealth: Payer: Self-pay

## 2021-06-04 NOTE — Telephone Encounter (Signed)
are you able to assist this patient she had surgery 05/30/21 and is wanting to know when she can go back to work, states it was never told to her and justwants to speak with someone about it    Please advise

## 2021-06-09 NOTE — Telephone Encounter (Signed)
I discussed patient request with provider Dr. Adrian Blackwater and recommendation is return to work 2 weeks after surgery and keep scheduled post op followup. I called Amiayah and informed her of recommendation. 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 I informed her since she had major abdominal surgery we do not advice her to return to work until 2 weeks after surgery to allow her body to start to heal.  We discussed risk of complications. I informed her I could provide a letter stating return to work 06/14/21 . She declined letter. I reviewed her post op appointmetn for 7/7/ 22. She voices understanding. Glenyce Randle,RN

## 2021-06-17 ENCOUNTER — Other Ambulatory Visit: Payer: Self-pay

## 2021-06-17 ENCOUNTER — Encounter: Payer: Self-pay | Admitting: Family Medicine

## 2021-06-17 ENCOUNTER — Ambulatory Visit (INDEPENDENT_AMBULATORY_CARE_PROVIDER_SITE_OTHER): Payer: BC Managed Care – PPO | Admitting: Family Medicine

## 2021-06-17 VITALS — BP 108/76 | HR 91 | Ht 65.0 in | Wt 144.6 lb

## 2021-06-17 DIAGNOSIS — Z8759 Personal history of other complications of pregnancy, childbirth and the puerperium: Secondary | ICD-10-CM

## 2021-06-17 DIAGNOSIS — Z09 Encounter for follow-up examination after completed treatment for conditions other than malignant neoplasm: Secondary | ICD-10-CM

## 2021-06-17 NOTE — Progress Notes (Signed)
   Subjective:    Patient ID: Charlotte Moore is a 40 y.o. female presenting with Post-op Follow-up  on 06/17/2021  HPI: Here following lap salpingectomy for ruptured ectopic pregnancy with hemorrhagic shock. She is feeling much better and has returned to work. She is having occasional cramping. Has normal urinary function. Bowels are moving.  Review of Systems  Constitutional:  Negative for chills and fever.  Respiratory:  Negative for shortness of breath.   Cardiovascular:  Negative for chest pain.  Gastrointestinal:  Negative for abdominal pain, nausea and vomiting.  Genitourinary:  Negative for dysuria.  Skin:  Negative for rash.     Objective:    BP 108/76   Pulse 91   Ht 5\' 5"  (1.651 m)   Wt 144 lb 9.6 oz (65.6 kg)   BMI 24.06 kg/m  Physical Exam Constitutional:      General: She is not in acute distress.    Appearance: She is well-developed.  HENT:     Head: Normocephalic and atraumatic.  Eyes:     General: No scleral icterus. Cardiovascular:     Rate and Rhythm: Normal rate.  Pulmonary:     Effort: Pulmonary effort is normal.  Abdominal:     Palpations: Abdomen is soft.     Comments: Large ecchymosis on left side  Musculoskeletal:     Cervical back: Neck supple.  Skin:    General: Skin is warm and dry.     Comments: Incisions are well healed  Neurological:     Mental Status: She is alert and oriented to person, place, and time.        Assessment & Plan:   Problem List Items Addressed This Visit       Unprioritized   S/P ectopic pregnancy    May try for pregnancy again when ready        Other Visit Diagnoses     Postop check    -  Primary   usual recovery and expectations reviewed.       Return for a CPE soon.  June 06/17/2021 3:55 PM

## 2021-06-17 NOTE — Assessment & Plan Note (Signed)
May try for pregnancy again when ready

## 2021-06-22 ENCOUNTER — Ambulatory Visit: Payer: BC Managed Care – PPO | Admitting: Family Medicine

## 2021-07-23 DIAGNOSIS — F4320 Adjustment disorder, unspecified: Secondary | ICD-10-CM | POA: Diagnosis not present

## 2021-08-04 DIAGNOSIS — F4320 Adjustment disorder, unspecified: Secondary | ICD-10-CM | POA: Diagnosis not present

## 2021-08-19 DIAGNOSIS — F4320 Adjustment disorder, unspecified: Secondary | ICD-10-CM | POA: Diagnosis not present

## 2021-08-27 DIAGNOSIS — J029 Acute pharyngitis, unspecified: Secondary | ICD-10-CM | POA: Diagnosis not present

## 2021-08-27 DIAGNOSIS — Z03818 Encounter for observation for suspected exposure to other biological agents ruled out: Secondary | ICD-10-CM | POA: Diagnosis not present

## 2021-09-01 DIAGNOSIS — F4320 Adjustment disorder, unspecified: Secondary | ICD-10-CM | POA: Diagnosis not present

## 2021-09-30 DIAGNOSIS — F4320 Adjustment disorder, unspecified: Secondary | ICD-10-CM | POA: Diagnosis not present

## 2021-10-15 DIAGNOSIS — F4320 Adjustment disorder, unspecified: Secondary | ICD-10-CM | POA: Diagnosis not present

## 2021-11-18 DIAGNOSIS — F4320 Adjustment disorder, unspecified: Secondary | ICD-10-CM | POA: Diagnosis not present

## 2021-12-17 DIAGNOSIS — F4320 Adjustment disorder, unspecified: Secondary | ICD-10-CM | POA: Diagnosis not present

## 2022-01-05 DIAGNOSIS — F4320 Adjustment disorder, unspecified: Secondary | ICD-10-CM | POA: Diagnosis not present

## 2022-01-10 ENCOUNTER — Other Ambulatory Visit: Payer: Self-pay

## 2022-01-10 ENCOUNTER — Ambulatory Visit (INDEPENDENT_AMBULATORY_CARE_PROVIDER_SITE_OTHER): Payer: BC Managed Care – PPO | Admitting: *Deleted

## 2022-01-10 ENCOUNTER — Encounter: Payer: Self-pay | Admitting: Family Medicine

## 2022-01-10 DIAGNOSIS — Z3201 Encounter for pregnancy test, result positive: Secondary | ICD-10-CM

## 2022-01-10 DIAGNOSIS — Z3200 Encounter for pregnancy test, result unknown: Secondary | ICD-10-CM | POA: Diagnosis not present

## 2022-01-10 LAB — POCT PREGNANCY, URINE: Preg Test, Ur: POSITIVE — AB

## 2022-01-10 NOTE — Progress Notes (Signed)
Ashly dropped off urine for pregnancy test which was positive. I called Auda and informed her upt positive. She reports LMP was 11/30/21 or maybe a day sooner. I informed her this makes her [redacted]w[redacted]d with EDD 09/06/22. I instructed her to start prenatal care with provider of her choice.  I also instructed her to start prenatal vitamins. She reports she would like to get prenatal care with our office. I instructed her to call or send message to make appointment. I informed her of choices of prenatal care. She is undecided. She has not yet gotten MyChart with [facility] but has MyChart with other [facility] provider. I sent her a text to see if she can link them.  Vershawn Westrup,RN

## 2022-01-10 NOTE — Patient Instructions (Addendum)
Prenatal Care Providers           [facility] for [facility] @ [facility]  [address] [phone]  [facility] for [facility] @ [facility]   [address]  [phone]  [facility]       [address] [phone]            [facility] for [facility] @ [place]     [phone]          [facility] for [facility] @ [facility]   [address] [phone]  [facility] for [facility] @ [facility]  [address] [phone]     Center for [facility] @ [address] [place])  [address]   [phone]     [facility]  Phone: [phone]  [facility]  Phone: [phone]  [facility] Phone: [phone]  [facility] Phone: [phone]  [facility] Phone: [phone]  [facility] Phone: [phone]  [facility]  Phone: [phone]   Thank you for your interest in [facility]Pregnancy.I wanted to give you a little more information. We are starting a new way to provide prenatal care - a way where there is less waiting and lots more fun and education. Youll learn about things like common discomforts of pregnancy, infant care, breastfeeding, nutrition and what to expect in labor. It's called CenteringPregnancy. You will meet in a group with other pregnant women due around the same time as you.? In Cent[facility] you will have individual time with the provider who will be in the room the whole time - so you will actually have much more time with your provider in Cent[facility] than in traditional prenatal care.? You will come directly into the [facility] room and will not wait in the lobby so there is no wasted time.? You will have 2-hour visits every 4 weeks then every 2 weeks. You will know your [facility] prenatal appointments in  advance. In your last month of pregnancy, you may also come in for some individual visits. Additional appointments can be scheduled if you need more care. Studies have shown that CenteringPregnancy improves birth outcomes. We have seen especially big improvements in fewer Black women delivering babies who are too small or born too early.  There is a website that you can browse. It is www.centeringhealthcare.org , look for centeringpregnancy

## 2022-01-11 ENCOUNTER — Other Ambulatory Visit: Payer: Self-pay

## 2022-01-11 DIAGNOSIS — O219 Vomiting of pregnancy, unspecified: Secondary | ICD-10-CM

## 2022-01-11 MED ORDER — PROMETHAZINE HCL 25 MG PO TABS: 25.0000 mg | ORAL_TABLET | Freq: Four times a day (QID) | ORAL | 0 refills | Status: DC | PRN

## 2022-01-17 ENCOUNTER — Other Ambulatory Visit: Payer: Self-pay

## 2022-01-17 DIAGNOSIS — O3680X0 Pregnancy with inconclusive fetal viability, not applicable or unspecified: Secondary | ICD-10-CM

## 2022-01-19 ENCOUNTER — Other Ambulatory Visit: Payer: Self-pay

## 2022-01-19 ENCOUNTER — Ambulatory Visit
Admission: RE | Admit: 2022-01-19 | Discharge: 2022-01-19 | Disposition: A | Discharge status: Home / Self Care | Payer: BC Managed Care – PPO | Source: Ambulatory Visit | Attending: Family Medicine | Admitting: Family Medicine

## 2022-01-19 ENCOUNTER — Telehealth: Payer: Self-pay | Admitting: Family Medicine

## 2022-01-19 ENCOUNTER — Ambulatory Visit (INDEPENDENT_AMBULATORY_CARE_PROVIDER_SITE_OTHER): Payer: BC Managed Care – PPO | Admitting: Certified Nurse Midwife

## 2022-01-19 ENCOUNTER — Encounter: Payer: Self-pay | Admitting: Certified Nurse Midwife

## 2022-01-19 VITALS — BP 124/63 | HR 99

## 2022-01-19 DIAGNOSIS — O3680X0 Pregnancy with inconclusive fetal viability, not applicable or unspecified: Secondary | ICD-10-CM

## 2022-01-19 NOTE — Telephone Encounter (Signed)
I am told the midwife saw the patient and counseled her today

## 2022-01-19 NOTE — Patient Instructions (Signed)
Scheduling [phone] 

## 2022-01-19 NOTE — Progress Notes (Signed)
S: Charlotte Moore is a G3P1011 at Unknown (should be 8wks by LMP but is 6wks on U/S) here for early ultrasound due to history of ectopic pregnancy (which ruptured and was fairly traumatic). Pt denies any abdominal pain or bleeding, is just anxious about the results of the ultrasound.  O: BP 124/63    Pulse 99    LMP 11/30/2021 (Approximate)  Physical Exam Vitals reviewed.  Constitutional:      General: She is not in acute distress.    Appearance: Normal appearance. She is normal weight.  HENT:     Head: Normocephalic and atraumatic.  Eyes:     Pupils: Pupils are equal, round, and reactive to light.  Cardiovascular:     Rate and Rhythm: Normal rate and regular rhythm.  Abdominal:     Tenderness: There is no abdominal tenderness.  Musculoskeletal:        General: Normal range of motion.  Skin:    General: Skin is warm and dry.     Capillary Refill: Capillary refill takes less than 2 seconds.  Neurological:     Mental Status: She is alert and oriented to person, place, and time.  Psychiatric:        Mood and Affect: Mood normal.        Behavior: Behavior normal.        Thought Content: Thought content normal.   US OB LESS THAN 14 WEEKS WITH OB TRANSVAGINAL  Result Date: 01/19/2022 CLINICAL DATA:  Early pregnancy. Assess viability. Last menstrual period 11/30/2021 (which corresponds to 7 weeks 1 day estimated gestational age. EXAM: OBSTETRIC <14 WK [place] AND TRANSVAGINAL OB US DOPPLER ULTRASOUND OF OVARIES TECHNIQUE: Both transabdominal and transvaginal ultrasound examinations were performed for complete evaluation of the gestation as well as the maternal uterus, adnexal regions, and pelvic cul-de-sac. Transvaginal technique was performed to assess early pregnancy. Color and duplex Doppler ultrasound was utilized to evaluate blood flow to the ovaries. COMPARISON:  None. FINDINGS: Intrauterine gestational sac: Single Yolk sac:  Visualized. Embryo:  Visualized. Cardiac Activity: Visualized. Heart  Rate: 85 bpm CRL:   3.6 mm   6 w 0 d                  [place] EDC: 09/14/2022 Subchorionic hemorrhage: None visualized. There are 3 oval-shaped bulging regions from the chorionic wall extending into the gestational sac measuring up to approximately 10 mm, 12 mm and 13 mm respectively (see image 65/79), predominantly heterogeneous with areas of increased echogenicity isoechoic to the chorion and two of the three demonstrating hypoechoic centers which may represent hematomas. Maternal uterus/adnexae: Probable corpus luteum within the right maternal ovary. The left maternal ovary was not visualized. Pulsed Doppler evaluation of both ovaries demonstrates normal appearing low-resistance arterial and venous waveforms. IMPRESSION:: IMPRESSION: 1. There is a single live intrauterine pregnancy. Fetal heart rate measures 85 beats per minute which is lower than expected. 2. By crown-rump length, estimated gestational age is 6 weeks 0 days. 3. As described above, there are 3 oval-shaped bulging regions extending into the gestational sac which are compatible with "chorionic bumps." These are rare, and the limited literature on this infrequent entity suggests that pregnancies with multiple chronic bumps may be at increased risk for fetal demise. Recommend continued attention on follow-up. Electronically Signed   By: Neita Garnet M.D.   On: 01/19/2022 10:49    A: Pregnancy with uncertain fetal viability - Reassured patient that she does not have an ectopic pregnancy. - Reviewed images  and report with patient and discussed increased possibility of miscarriage due to presence of chorionic bumps and low FHR.  - Needs repeat u/s in one week (Dr. Crissie Reese agreed) to check fetal viability. - Gave strong bleeding precautions and discussed when to present to MAU and how we will proceed if this becomes a miscarriage. Condolences extended as well as reassurance.  P: Follow up U/S in one week.

## 2022-01-27 ENCOUNTER — Ambulatory Visit (INDEPENDENT_AMBULATORY_CARE_PROVIDER_SITE_OTHER): Payer: BC Managed Care – PPO | Admitting: Family Medicine

## 2022-01-27 ENCOUNTER — Other Ambulatory Visit: Payer: Self-pay

## 2022-01-27 ENCOUNTER — Encounter: Payer: Self-pay | Admitting: Family Medicine

## 2022-01-27 ENCOUNTER — Ambulatory Visit
Admission: RE | Admit: 2022-01-27 | Discharge: 2022-01-27 | Disposition: A | Discharge status: Home / Self Care | Payer: BC Managed Care – PPO | Source: Ambulatory Visit | Attending: Certified Nurse Midwife | Admitting: Certified Nurse Midwife

## 2022-01-27 VITALS — BP 122/78 | HR 91 | Wt 147.0 lb

## 2022-01-27 DIAGNOSIS — O3680X0 Pregnancy with inconclusive fetal viability, not applicable or unspecified: Secondary | ICD-10-CM | POA: Diagnosis not present

## 2022-01-27 DIAGNOSIS — Z3490 Encounter for supervision of normal pregnancy, unspecified, unspecified trimester: Secondary | ICD-10-CM

## 2022-01-27 DIAGNOSIS — R001 Bradycardia, unspecified: Secondary | ICD-10-CM | POA: Diagnosis not present

## 2022-01-27 DIAGNOSIS — Z3A01 Less than 8 weeks gestation of pregnancy: Secondary | ICD-10-CM | POA: Diagnosis not present

## 2022-01-27 NOTE — Progress Notes (Signed)
°  Amenorrhea with positive UPT PROBLEM  VISIT ENCOUNTER NOTE  Subjective:   Charlotte Moore is a 41 y.o. [phone] female here for positive pregnancy test.  She has not had serial bhcg. Her last [place] was today-- had prior US on 2/8 and it was concerning for pregnancy loss due to chorionic bumps and low HR. Today US shows no FHR but is not definitive.   Denies abnormal vaginal bleeding, discharge, pelvic pain, problems with intercourse or other gynecologic concerns.    Gynecologic History Patient's last menstrual period was 11/30/2021 (approximate).  Health Maintenance Due  Topic Date Due   COVID-19 Vaccine (1) Never done   HIV Screening  Never done   Hepatitis C Screening  Never done   TETANUS/TDAP  Never done   PAP SMEAR-Modifier  Never done   INFLUENZA VACCINE  Never done    The following portions of the patient's history were reviewed and updated as appropriate: allergies, current medications, past family history, past medical history, past social history, past surgical history and problem list.  Review of Systems Pertinent items are noted in HPI.   Objective:  BP 122/78    Pulse 91    Wt 147 lb (66.7 kg)    LMP 11/30/2021 (Approximate)    BMI 24.46 kg/m  Gen: well appearing, NAD. Appropriately tearful HEENT: no scleral icterus CV: RR Lung: Normal WOB Ext: warm well perfused   Assessment and Plan:   1. Early stage of pregnancy Discussed that this is likely a failed pregnancy but does not meet definitve criteria. Reviewed she may start bleeding and this would confirm  Reviewed role of bhcg serially to help Patient would like this Reviewed options for miscarriage management-expectant, cytotec and need for f.u [place] to confirm passage and possible DC if not. - [place] MFM OB TRANSVAGINAL; Future  2. Pregnancy with uncertain fetal viability, single or unspecified fetus - [place] MFM OB TRANSVAGINAL; Future  Face to face time:  30 minutes  Greater than 50% of the visit time was spent in  counseling and coordination of care with the patient.  The summary and outline of the counseling and care coordination is summarized in the note above.   All questions were answered.   Please refer to After Visit Summary for other counseling recommendations.   Return in about 4 days (around 01/31/2022) for bhcg.  Future Appointments  Date Time Provider [place]  01/31/2022  8:50 AM [facility]  02/02/2022  9:30 AM [facility]  02/10/2022  7:30 AM [facility] NURSE [facility]  02/10/2022  7:45 AM [facility] US5 [facility]    Caren Macadam, MD, MPH, ABFM Attending [place] for [facility]

## 2022-01-28 ENCOUNTER — Encounter: Payer: Self-pay | Admitting: Family Medicine

## 2022-01-28 DIAGNOSIS — F4320 Adjustment disorder, unspecified: Secondary | ICD-10-CM | POA: Diagnosis not present

## 2022-01-31 ENCOUNTER — Other Ambulatory Visit: Payer: Self-pay

## 2022-01-31 ENCOUNTER — Other Ambulatory Visit: Payer: BC Managed Care – PPO

## 2022-01-31 DIAGNOSIS — Z8759 Personal history of other complications of pregnancy, childbirth and the puerperium: Secondary | ICD-10-CM

## 2022-01-31 DIAGNOSIS — Z3490 Encounter for supervision of normal pregnancy, unspecified, unspecified trimester: Secondary | ICD-10-CM

## 2022-02-01 ENCOUNTER — Other Ambulatory Visit: Payer: Self-pay

## 2022-02-01 ENCOUNTER — Other Ambulatory Visit: Payer: BC Managed Care – PPO

## 2022-02-01 DIAGNOSIS — Z8759 Personal history of other complications of pregnancy, childbirth and the puerperium: Secondary | ICD-10-CM | POA: Diagnosis not present

## 2022-02-01 DIAGNOSIS — Z3490 Encounter for supervision of normal pregnancy, unspecified, unspecified trimester: Secondary | ICD-10-CM

## 2022-02-02 ENCOUNTER — Encounter: Payer: Self-pay | Admitting: Family Medicine

## 2022-02-02 ENCOUNTER — Other Ambulatory Visit: Payer: BC Managed Care – PPO

## 2022-02-02 LAB — BETA HCG QUANT (REF LAB): hCG Quant: 43195 m[IU]/mL

## 2022-02-03 ENCOUNTER — Other Ambulatory Visit: Payer: BC Managed Care – PPO

## 2022-02-03 ENCOUNTER — Other Ambulatory Visit: Payer: Self-pay

## 2022-02-03 DIAGNOSIS — Z3490 Encounter for supervision of normal pregnancy, unspecified, unspecified trimester: Secondary | ICD-10-CM

## 2022-02-03 DIAGNOSIS — Z8759 Personal history of other complications of pregnancy, childbirth and the puerperium: Secondary | ICD-10-CM

## 2022-02-04 ENCOUNTER — Encounter: Payer: Self-pay | Admitting: *Deleted

## 2022-02-04 LAB — BETA HCG QUANT (REF LAB): hCG Quant: 33747 m[IU]/mL

## 2022-02-07 ENCOUNTER — Encounter: Payer: Self-pay | Admitting: Family Medicine

## 2022-02-10 ENCOUNTER — Ambulatory Visit: Payer: BC Managed Care – PPO

## 2022-02-10 ENCOUNTER — Other Ambulatory Visit: Payer: BC Managed Care – PPO

## 2022-02-16 ENCOUNTER — Ambulatory Visit: Payer: BC Managed Care – PPO | Attending: Family Medicine

## 2022-02-16 ENCOUNTER — Encounter: Payer: Self-pay | Admitting: Family Medicine

## 2022-02-16 ENCOUNTER — Ambulatory Visit (INDEPENDENT_AMBULATORY_CARE_PROVIDER_SITE_OTHER): Payer: BC Managed Care – PPO | Admitting: Family Medicine

## 2022-02-16 ENCOUNTER — Ambulatory Visit: Payer: BC Managed Care – PPO | Admitting: *Deleted

## 2022-02-16 ENCOUNTER — Other Ambulatory Visit: Payer: Self-pay

## 2022-02-16 VITALS — BP 112/58 | HR 97

## 2022-02-16 DIAGNOSIS — Z3490 Encounter for supervision of normal pregnancy, unspecified, unspecified trimester: Secondary | ICD-10-CM | POA: Insufficient documentation

## 2022-02-16 DIAGNOSIS — Z3A11 11 weeks gestation of pregnancy: Secondary | ICD-10-CM | POA: Diagnosis not present

## 2022-02-16 DIAGNOSIS — Z3686 Encounter for antenatal screening for cervical length: Secondary | ICD-10-CM | POA: Insufficient documentation

## 2022-02-16 DIAGNOSIS — O021 Missed abortion: Secondary | ICD-10-CM

## 2022-02-16 DIAGNOSIS — O3680X0 Pregnancy with inconclusive fetal viability, not applicable or unspecified: Secondary | ICD-10-CM | POA: Insufficient documentation

## 2022-02-16 NOTE — Progress Notes (Signed)
? ?  Subjective:  ? ? Patient ID: Charlotte Moore, female    DOB: 1981-04-13, 41 y.o.   MRN: [id number] ? ?HPI ? ?Reviewed [place] with patient - no cardiac activity. At this point, this meets the definition of failed pregnancy. No cramping and bleeding. Somewhat relieved to have a final answer. ? ?Review of Systems ? ?   ?Objective:  ? Physical Exam ?Vitals reviewed.  ?Constitutional:   ?   Appearance: Normal appearance.  ?Skin: ?   General: Skin is warm and dry.  ?   Capillary Refill: Capillary refill takes less than 2 seconds.  ?Neurological:  ?   General: No focal deficit present.  ?   Mental Status: She is alert.  ?Psychiatric:     ?   Mood and Affect: Mood normal.     ?   Behavior: Behavior normal.  ? ?   ?Assessment & Plan:  ?1. Missed abortion ?Discussed options in detail with patient - medical vs surgical completion. Patient would like to think about options and will get back about preference.  ? ?

## 2022-02-17 ENCOUNTER — Encounter: Payer: BC Managed Care – PPO | Admitting: Family Medicine

## 2022-02-22 ENCOUNTER — Telehealth: Payer: Self-pay

## 2022-02-22 NOTE — Progress Notes (Signed)
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 Dr. Olivia Mackie office number given and instructions given to call office to reschedule. Patient verbalized understanding. ?

## 2022-02-22 NOTE — Telephone Encounter (Signed)
Called patient, surgery date, time and preop instructions given. Patient expressed understanding. 

## 2022-02-23 ENCOUNTER — Telehealth: Payer: Self-pay | Admitting: Obstetrics and Gynecology

## 2022-02-23 DIAGNOSIS — O039 Complete or unspecified spontaneous abortion without complication: Secondary | ICD-10-CM

## 2022-02-23 DIAGNOSIS — O021 Missed abortion: Secondary | ICD-10-CM

## 2022-02-23 HISTORY — DX: Complete or unspecified spontaneous abortion without complication: O03.9

## 2022-02-23 MED ORDER — OXYCODONE HCL 5 MG PO TABS: 5.0000 mg | ORAL_TABLET | ORAL | 0 refills | Status: DC | PRN

## 2022-02-23 MED ORDER — MISOPROSTOL 200 MCG PO TABS: ORAL_TABLET | ORAL | 0 refills | Status: DC

## 2022-02-23 NOTE — H&P (Signed)
[facility]ulty [facility] Obstetrics and Gynecology Attending History and Physical ? ?Charlotte Moore is a 41 y.o. G3P1011 at [redacted]w[redacted]d who has confirmed SAB by [place]. She desires surgical management of SAB.  ? ? ?Past Medical History:  ?Diagnosis Date  ? No pertinent past medical history   ? ?Past Surgical History:  ?Procedure Laterality Date  ? DIAGNOSTIC LAPAROSCOPY WITH REMOVAL OF ECTOPIC PREGNANCY N/A 05/30/2021  ? Procedure: DIAGNOSTIC LAPAROSCOPY WITH REMOVAL OF ECTOPIC PREGNANCY;  Surgeon: Donnamae Jude, MD;  Location: [facility];  Service: Gynecology;  Laterality: N/A;  ? NO PAST SURGERIES    ? ?OB History  ?Gravida Para Term Preterm AB Living  ?3 1 1   1 1   ?SAB IAB Ectopic Multiple Live Births  ?    1   1  ?  ?# Outcome Date GA Lbr Len/2nd Weight Sex Delivery Anes PTL Lv  ?3 Current           ?2 Ectopic           ?1 Term           ?  ?Obstetric Comments  ?Pt reports having 1 pregnancy w/ 1 living child, without any complications.  ?Patient denies any other pertinent gynecologic issues.  ?No current facility-administered medications on file prior to encounter.  ? ?Current Outpatient Medications on File Prior to Encounter  ?Medication Sig Dispense Refill  ? acetaminophen (TYLENOL) 500 MG tablet Take 500 mg by mouth every 6 (six) hours as needed for moderate pain.    ? ibuprofen (ADVIL) 200 MG tablet Take 600 mg by mouth every 6 (six) hours as needed for moderate pain.    ? ?No Known Allergies ? ?Social History:   reports that she has never smoked. She has never used smokeless tobacco. She reports that she does not currently use alcohol. She reports that she does not use drugs. ?Family History  ?Problem Relation Age of Onset  ? Asthma Neg Hx   ? Diabetes Neg Hx   ? Heart disease Neg Hx   ? Hypertension Neg Hx   ? Stroke Neg Hx   ? ? ?Review of Systems: Pertinent items noted in HPI and remainder of comprehensive ROS otherwise negative. ? ?PHYSICAL EXAM: ?Last menstrual period 11/30/2021. ?CONSTITUTIONAL: Well-developed,  well-nourished female in no acute distress.  ?HENT:  Normocephalic, atraumatic, External right and left ear normal. Oropharynx is clear and moist ?EYES: Conjunctivae and EOM are normal. Pupils are equal, round, and reactive to light. No scleral icterus.  ?NECK: Normal range of motion, supple, no masses ?SKIN: Skin is warm and dry. No rash noted. Not diaphoretic. No erythema. No pallor. ?NEUROLOGIC: Alert and oriented to person, place, and time. Normal reflexes, muscle tone coordination. No cranial nerve deficit noted. ?PSYCHIATRIC: Normal mood and affect. Normal behavior. Normal judgment and thought content. ?CARDIOVASCULAR: Normal heart rate noted, regular rhythm ?RESPIRATORY: Effort and breath sounds normal, no problems with respiration noted ?ABDOMEN: Soft, nontender, nondistended. ?PELVIC: Not examined ?MUSCULOSKELETAL: Normal range of motion. No tenderness.  No cyanosis, clubbing, or edema.  2+ distal pulses. ? ?Labs: ?No results found for this or any previous visit (from the past 336 hour(s)). ? ?Imaging Studies: ?US OB Transvaginal ? ?Result Date: 01/27/2022 ?CLINICAL DATA:  Bradycardia. EXAM: TRANSVAGINAL OB ULTRASOUND TECHNIQUE: Transvaginal ultrasound was performed for complete evaluation of the gestation as well as the maternal uterus, adnexal regions, and pelvic cul-de-sac. COMPARISON:  January 19, 2022. FINDINGS: Intrauterine gestational sac: Single Yolk sac:  Visualized. Embryo:  Visualized. Cardiac  Activity: Not Visualized. CRL:   4.1 mm   6 w 1 d                  [place] EDC: September 21, 2022. Subchorionic hemorrhage:  Small subchorionic hemorrhage is noted. Maternal uterus/adnexae: Right ovary appears normal. Left ovary is not visualized. No free fluid is noted. IMPRESSION: Fetal pole is noted corresponding to approximately 6 weeks and 1 day of gestational age. Fetal heart rate noted on prior exam is not noted currently. Findings are suspicious but not yet definitive for failed pregnancy. Recommend  follow-up US in 10-14 days for definitive diagnosis. This recommendation follows SRU consensus guidelines: Diagnostic Criteria for Nonviable Pregnancy Early in the First Trimester. [employer] Med [id number]. Electronically Signed   By: Marijo Conception M.D.   On: 01/27/2022 08:45  ? ?[place] MFM OB TRANSVAGINAL ? ?Result Date: 02/16/2022 ?----------------------------------------------------------------------  OBSTETRICS REPORT                       (Signed Final 02/16/2022 03:19 pm) ---------------------------------------------------------------------- Patient Info  ID #:       [id number]                          D.O.B.:  26-Apr-1981 (40 yrs)  Name:       Charlotte Moore                     Visit Date: 02/16/2022 02:18 pm ---------------------------------------------------------------------- Performed By  Attending:        Sander Nephew      Ref. Address:     [address]                    MD                                                             [place], [place]  Performed By:     Rodrigo Ran BS      [facility]                                 [facility]                                                             [place] [facility]  Referred By:      [facility] ---------------------------------------------------------------------- Orders  #  Description                           Code        Ordered By  1  [place] MFM OB TRANSVAGINAL                [phone]     Lauretta Chester ----------------------------------------------------------------------  #  Order #                     Accession #                Episode #  1  [id number]                   [id number]                 [id number] ---------------------------------------------------------------------- Indications  [redacted] weeks gestation of pregnancy                 Z3A.11  Pregnancy with inconclusive fetal viability    O36.80X0 ---------------------------------------------------------------------- Fetal Evaluation  Num Of Fetuses:         1  Preg. Location:         Intrauterine  Gest. Sac:              Intrauterine  Yolk Sac:               Not visualized  Fetal Pole:             Visualized  Cardiac Activity:       Absent ---------------------------------------------------------------------- Biometry  GS:         31  mm     G. Age:  8w 6d                   EDD:   09/22/22  CRL:       4.4  mm     G. Age:  6w 1d                   EDD:   10/11/22 ---------------------------------------------------------------------- OB History  Gravidity:    3         Term:   1        Prem:   0        SAB:   0  TOP:          0       Ectopic:  1        Living: 0 ---------------------------------------------------------------------- Gestational Age  LMP:           11w 1d        Date:  11/30/21                 EDD:   09/06/22  Best:          11w 1d     Det. By:  LMP  (11/30/21)          EDD:   09/06/22 ---------------------------------------------------------------------- Cervix Uterus Adnexa  Cervix  Closed  Uterus  No abnormality visualized.  Right Ovary  Not visualized.  Left Ovary  Within normal limits.  Cul De Sac  No free fluid seen.  Adnexa  No abnormality visualized. ----------------------------------------------------------------------  Impression  Limited exam to assess viability.  Non-viable pregnancy.  She denies vaginal bleeding.  She is scheduled to see her providers for further counseling  after this visit.  All questions answered. ----------------------------------------------------------------------               Sander Nephew, MD Electronically Signed Final Report   02/16/2022 03:19 pm ----------------------------------------------------------------------  ? ?Assessment: ?Active Problems: ?  Miscarriage ? ? ?Plan: ?- She has been counseled on alternatives to surgery I.e.  expectant management and medication and after considering her options she desired surgery.  ?- Risks of surgery include but are not limited to: bleeding, infection, injury to surrounding organs/tissues (i.e. bowel/bladder/u

## 2022-02-23 NOTE — Telephone Encounter (Signed)
Spoke with Ms. Edwyna Shell at length regarding process and expectations for medical management of miscarriage. She has started to have some vaginal bleeding. Reviewed with her the ultrasounds she has had and would define (in retrospect) the definition of SAB was mid-February.  ? ?We discussed the option for expectant management and in the abscess of fever and pain, that she may continue this option safely for up to 6 weeks (so another 2 weeks).  ? ?We discussed the option for medical management. Reviewed based on her measurements and timing (~6 wks) as well as the onset of bleeding, she has a likely success rate of about 90% (defined as avoiding surgery).  ? ?We reviewed the process of medical management. Discussed normal time frame for cramping/bleeding. Reviewed bleeding expectations for the 1-2 hours at the worst for the miscarriage. Reviewed bleeding then will taper down and then will continue for 1-2 weeks following SAB. Discussed cramping can be intense and motrin/tylenol would be first line, but may take strong medication if needed. Reviewed it would be an opioid and would thus recommend minimal use due to risk of addiction (although low, sent in 4 tabs).  ? ?We reviewed [place] in one week following administration of medication to ensure gestational sac has passed. Reviewed [place] often finds thickened EL at this time, but this does not mean RPOC - we would monitor her bleeding following the procedure and if it follows normal course then I would manage her clinically based on this. She will call the office to arrange once she knows what day she plans to do the medications.  ? ?Following our conversation, she would like to try medical management. We discussed that she may still need surgery, but if her hope is to avoid it, this is an excellent option. She does wish to avoid surgery if possible due to prior experience in the hospital with her ectopic pregnancy which was traumatic for her.  ? ?I will cancel her surgery with  our scheduler.  ? ? ?Milas Hock, MD ?Attending Obstetrician & Gynecologist, Faculty Practice ?[facility] [employer], [facility] ? ? ?

## 2022-02-25 ENCOUNTER — Ambulatory Visit (HOSPITAL_COMMUNITY)
Admission: RE | Admit: 2022-02-25 | Payer: BC Managed Care – PPO | Source: Home / Self Care | Admitting: Obstetrics and Gynecology

## 2022-02-25 DIAGNOSIS — O039 Complete or unspecified spontaneous abortion without complication: Secondary | ICD-10-CM

## 2022-02-25 DIAGNOSIS — Z8759 Personal history of other complications of pregnancy, childbirth and the puerperium: Secondary | ICD-10-CM

## 2022-02-25 SURGERY — DILATION AND EVACUATION, UTERUS
Anesthesia: Choice

## 2022-04-21 DIAGNOSIS — F4320 Adjustment disorder, unspecified: Secondary | ICD-10-CM | POA: Diagnosis not present

## 2022-05-27 DIAGNOSIS — F4320 Adjustment disorder, unspecified: Secondary | ICD-10-CM | POA: Diagnosis not present

## 2022-07-08 DIAGNOSIS — F4320 Adjustment disorder, unspecified: Secondary | ICD-10-CM | POA: Diagnosis not present

## 2022-08-19 DIAGNOSIS — F4320 Adjustment disorder, unspecified: Secondary | ICD-10-CM | POA: Diagnosis not present

## 2022-09-16 DIAGNOSIS — F4320 Adjustment disorder, unspecified: Secondary | ICD-10-CM | POA: Diagnosis not present

## 2022-09-30 DIAGNOSIS — F4320 Adjustment disorder, unspecified: Secondary | ICD-10-CM | POA: Diagnosis not present

## 2022-11-11 DIAGNOSIS — F4320 Adjustment disorder, unspecified: Secondary | ICD-10-CM | POA: Diagnosis not present

## 2022-11-17 ENCOUNTER — Ambulatory Visit: Payer: BC Managed Care – PPO | Admitting: Podiatry

## 2022-11-25 DIAGNOSIS — F4320 Adjustment disorder, unspecified: Secondary | ICD-10-CM | POA: Diagnosis not present

## 2023-01-06 DIAGNOSIS — F4320 Adjustment disorder, unspecified: Secondary | ICD-10-CM | POA: Diagnosis not present

## 2023-02-17 ENCOUNTER — Encounter: Payer: Self-pay | Admitting: Family Medicine

## 2023-02-17 DIAGNOSIS — F4320 Adjustment disorder, unspecified: Secondary | ICD-10-CM | POA: Diagnosis not present

## 2023-02-20 ENCOUNTER — Encounter: Payer: Self-pay | Admitting: Family Medicine

## 2023-02-20 ENCOUNTER — Ambulatory Visit (INDEPENDENT_AMBULATORY_CARE_PROVIDER_SITE_OTHER): Payer: BC Managed Care – PPO | Admitting: Family Medicine

## 2023-02-20 VITALS — BP 128/79 | HR 99 | Temp 98.9°F | Ht 66.0 in | Wt 162.2 lb

## 2023-02-20 DIAGNOSIS — Z1231 Encounter for screening mammogram for malignant neoplasm of breast: Secondary | ICD-10-CM

## 2023-02-20 DIAGNOSIS — Z114 Encounter for screening for human immunodeficiency virus [HIV]: Secondary | ICD-10-CM

## 2023-02-20 DIAGNOSIS — Z23 Encounter for immunization: Secondary | ICD-10-CM | POA: Diagnosis not present

## 2023-02-20 DIAGNOSIS — Z13220 Encounter for screening for lipoid disorders: Secondary | ICD-10-CM

## 2023-02-20 DIAGNOSIS — Z7689 Persons encountering health services in other specified circumstances: Secondary | ICD-10-CM

## 2023-02-20 DIAGNOSIS — Z130 Encounter for screening for diseases of the blood and blood-forming organs and certain disorders involving the immune mechanism: Secondary | ICD-10-CM

## 2023-02-20 DIAGNOSIS — Z0000 Encounter for general adult medical examination without abnormal findings: Secondary | ICD-10-CM | POA: Diagnosis not present

## 2023-02-20 DIAGNOSIS — Z131 Encounter for screening for diabetes mellitus: Secondary | ICD-10-CM

## 2023-02-20 DIAGNOSIS — Z1159 Encounter for screening for other viral diseases: Secondary | ICD-10-CM

## 2023-02-20 DIAGNOSIS — F419 Anxiety disorder, unspecified: Secondary | ICD-10-CM

## 2023-02-20 LAB — COMPREHENSIVE METABOLIC PANEL
ALT: 10 U/L (ref 0–35)
AST: 12 U/L (ref 0–37)
Albumin: 4.3 g/dL (ref 3.5–5.2)
Alkaline Phosphatase: 45 U/L (ref 39–117)
BUN: 12 mg/dL (ref 6–23)
CO2: 28 mEq/L (ref 19–32)
Calcium: 9.8 mg/dL (ref 8.4–10.5)
Chloride: 103 mEq/L (ref 96–112)
Creatinine, Ser: 0.81 mg/dL (ref 0.40–1.20)
GFR: 90.94 mL/min (ref >60.00)
Glucose, Bld: 61 mg/dL — ABNORMAL LOW (ref 70–99)
Potassium: 4.4 mEq/L (ref 3.5–5.1)
Sodium: 138 mEq/L (ref 135–145)
Total Bilirubin: 0.6 mg/dL (ref 0.2–1.2)
Total Protein: 6.9 g/dL (ref 6.0–8.3)

## 2023-02-20 LAB — LIPID PANEL
Cholesterol: 155 mg/dL (ref 0–200)
HDL: 73.6 mg/dL (ref >39.00)
LDL Cholesterol: 64 mg/dL (ref 0–99)
NonHDL: 81.02
Total CHOL/HDL Ratio: 2
Triglycerides: 84 mg/dL (ref 0.0–149.0)
VLDL: 16.8 mg/dL (ref 0.0–40.0)

## 2023-02-20 LAB — CBC
HCT: 42 % (ref 36.0–46.0)
Hemoglobin: 13.9 g/dL (ref 12.0–15.0)
MCHC: 33.1 g/dL (ref 30.0–36.0)
MCV: 91.1 fl (ref 78.0–100.0)
Platelets: 262 10*3/uL (ref 150.0–400.0)
RBC: 4.61 Mil/uL (ref 3.87–5.11)
RDW: 13.6 % (ref 11.5–15.5)
WBC: 5.9 10*3/uL (ref 4.0–10.5)

## 2023-02-20 LAB — HEMOGLOBIN A1C: Hgb A1c MFr Bld: 5.2 % (ref 4.6–6.5)

## 2023-02-20 LAB — TSH: TSH: 0.79 u[IU]/mL (ref 0.35–5.50)

## 2023-02-20 NOTE — Progress Notes (Signed)
Patient ID: Charlotte Moore, female  DOB: 1981/04/05, 42 y.o.   MRN: [id number] Patient Care Team    Relationship Specialty Notifications Start End  Ma Hillock, DO PCP - General Family Medicine  02/20/23     Chief Complaint  Patient presents with   Establish Care   Annual Exam    Pt is not fasting    Subjective:  Charlotte Moore is a 42 y.o.  female present for new patient establishment. All past medical history, surgical history, allergies, family history, immunizations, medications and social history were updated in the electronic medical record today. All recent labs, ED visits and hospitalizations within the last year were reviewed.  Health maintenance:  Colon cancer screen: no fhx. Screen at 8 Mammogram: no fhx.:ordered [facility] Cervical cancer screening: pt will est w/ gyn Immunizations: tdap declined, Influenza declined (encouraged yearly) Infectious disease screening: HIV declined, Hep C declined DEXA: routine screen Assistive device: none Oxygen [id number] Patient has a Dental home. Hospitalizations/ED visits:reviewed     02/20/2023    8:45 AM 06/17/2021   10:52 AM  Depression screen PHQ 2/9  Decreased Interest 0 0  Down, Depressed, Hopeless 1 1  PHQ - 2 Score 1 1  Altered sleeping  0  Tired, decreased energy  1  Change in appetite  0  Feeling bad or failure about yourself   0  Trouble concentrating  1  Moving slowly or fidgety/restless  0  Suicidal thoughts  0  PHQ-9 Score  3      02/20/2023    8:45 AM 06/17/2021   10:53 AM  GAD 7 : Generalized Anxiety Score  Nervous, Anxious, on Edge 1 1  Control/stop worrying 1 1  Worry too much - different things 1 1  Trouble relaxing 1 1  Restless 1 0  Easily annoyed or irritable 1 1  Afraid - awful might happen 0 0  Total GAD 7 Score 6 5             02/20/2023    8:45 AM  Fall Risk   Falls in the past year? 0  Injury with Fall? 0  Follow up Falls evaluation completed      There is no immunization  history on file for this patient.  No results found.  Past Medical History:  Diagnosis Date   Chicken pox    Ectopic pregnancy 2022   Miscarriage 02/23/2022   S/P ectopic pregnancy 05/30/2021   S/p left salpingectomy   No Known Allergies Past Surgical History:  Procedure Laterality Date   DIAGNOSTIC LAPAROSCOPY WITH REMOVAL OF ECTOPIC PREGNANCY N/A 05/30/2021   Procedure: DIAGNOSTIC LAPAROSCOPY WITH REMOVAL OF ECTOPIC PREGNANCY;  Surgeon: Donnamae Jude, MD;  Location: [facility];  Service: Gynecology;  Laterality: N/A;   Family History  Problem Relation Age of Onset   20 / [place] Mother    Depression Mother    Kidney disease Father    Intellectual disability Father    Asthma Neg Hx    Diabetes Neg Hx    Heart disease Neg Hx    Hypertension Neg Hx    Stroke Neg Hx    Social History   Social History Narrative   Marital status/children/pets: Married, 1 child   Education/employment: Printmaker, employed as a Occupational hygienist:      -smoke alarm in the home:Yes     - wears seatbelt: Yes     - Feels safe in their relationships: Yes  Allergies as of 02/20/2023   No Known Allergies      Medication List        Accurate as of February 20, 2023  8:59 AM. If you have any questions, ask your nurse or doctor.          STOP taking these medications    misoprostol 200 MCG tablet Commonly known as: CYTOTEC Stopped by: Howard Pouch, DO        All past medical history, surgical history, allergies, family history, immunizations andmedications were updated in the EMR today and reviewed under the history and medication portions of their EMR.    No results found for this or any previous visit (from the past 2160 hour(s)).   ROS 14 pt review of systems performed and negative (unless mentioned in an HPI)  Objective: BP 128/79   Pulse 99   Temp 98.9 F (37.2 C)   Ht '5\' 6"'$  (1.676 m)   Wt 162 lb 3.2 oz (73.6 kg)   LMP 01/31/2023   SpO2 99%    BMI 26.18 kg/m   Physical Exam Vitals and nursing note reviewed.  Constitutional:      General: She is not in acute distress.    Appearance: Normal appearance. She is not ill-appearing or toxic-appearing.  HENT:     Head: Normocephalic and atraumatic.     Right Ear: Tympanic membrane, ear canal and external ear normal. There is no impacted cerumen.     Left Ear: Tympanic membrane, ear canal and external ear normal. There is no impacted cerumen.     Nose: No congestion or rhinorrhea.     Mouth/Throat:     Mouth: Mucous membranes are moist.     Pharynx: Oropharynx is clear. No oropharyngeal exudate or posterior oropharyngeal erythema.  Eyes:     General: No scleral icterus.       Right eye: No discharge.        Left eye: No discharge.     Extraocular Movements: Extraocular movements intact.     Conjunctiva/sclera: Conjunctivae normal.     Pupils: Pupils are equal, round, and reactive to light.  Cardiovascular:     Rate and Rhythm: Normal rate and regular rhythm.     Pulses: Normal pulses.     Heart sounds: Normal heart sounds. No murmur heard.    No friction rub. No gallop.  Pulmonary:     Effort: Pulmonary effort is normal. No respiratory distress.     Breath sounds: Normal breath sounds. No stridor. No wheezing, rhonchi or rales.  Chest:     Chest wall: No tenderness.  Abdominal:     General: Abdomen is flat. Bowel sounds are normal. There is no distension.     Palpations: Abdomen is soft. There is no mass.     Tenderness: There is no abdominal tenderness. There is no right CVA tenderness, left CVA tenderness, guarding or rebound.     Hernia: No hernia is present.  Musculoskeletal:        General: No swelling, tenderness or deformity. Normal range of motion.     Cervical back: Normal range of motion and neck supple. No rigidity or tenderness.     Right lower leg: No edema.     Left lower leg: No edema.  Lymphadenopathy:     Cervical: No cervical adenopathy.  Skin:     General: Skin is warm and dry.     Coloration: Skin is not jaundiced or pale.     Findings: No  bruising, erythema, lesion or rash.  Neurological:     General: No focal deficit present.     Mental Status: She is alert and oriented to person, place, and time. Mental status is at baseline.     Cranial Nerves: No cranial nerve deficit.     Sensory: No sensory deficit.     Motor: No weakness.     Coordination: Coordination normal.     Gait: Gait normal.     Deep Tendon Reflexes: Reflexes normal.  Psychiatric:        Mood and Affect: Mood normal.        Behavior: Behavior normal.        Thought Content: Thought content normal.        Judgment: Judgment normal.         Assessment/plan: Charlotte Moore is a 42 y.o. female present for est care /cpe Establishing care with new doctor, encounter for Breast cancer screening by mammogram - MM 3D SCREENING MAMMOGRAM BILATERAL BREAST; Future Need for Tdap vaccination declined Lipid screening - Lipid panel Diabetes mellitus screening - Comp Met (CMET) - Hemoglobin A1c Screening for deficiency anemia - CBc Encounter for screening for HIV/hepc  declined  Anxiety Est with therapist  She was encouraged to schedule an appt if desiring to discuss medication start - TSH Routine general medical examination at a health care facility Patient was encouraged to exercise greater than 150 minutes a week. Patient was encouraged to choose a diet filled with fresh fruits and vegetables, and lean meats. AVS provided to patient today for education/recommendation on gender specific health and safety maintenance. Colon cancer screen: no fhx. Screen at 62 Mammogram: no fhx.:ordered [facility] Cervical cancer screening: pt will est w/ gyn Immunizations: tdap declined, Influenza declined (encouraged yearly) Infectious disease screening: HIV declined, Hep C declined DEXA: routine screen    Return in about 1 year (around 02/22/2024) for cpe (20 min).  Orders  Placed This Encounter  Procedures   MM 3D SCREENING MAMMOGRAM BILATERAL BREAST   CBC   Comp Met (CMET)   Lipid panel   Hemoglobin A1c   TSH   No orders of the defined types were placed in this encounter.  Referral Orders  No referral(s) requested today     Note is dictated utilizing voice recognition software. Although note has been proof read prior to signing, occasional typographical errors still can be missed. If any questions arise, please do not hesitate to call for verification.  Electronically signed by: Howard Pouch, DO [place]

## 2023-02-20 NOTE — Patient Instructions (Addendum)
Return in about 1 year (around 02/22/2024) for cpe (20 min).        Great to see you today.  I have refilled the medication(s) we provide.   If labs were collected, we will inform you of lab results once received either by echart message or telephone call.   - echart message- for normal results that have been seen by the patient already.   - telephone call: abnormal results or if patient has not viewed results in their echart.

## 2023-03-03 DIAGNOSIS — F4320 Adjustment disorder, unspecified: Secondary | ICD-10-CM | POA: Diagnosis not present

## 2023-03-31 DIAGNOSIS — F4320 Adjustment disorder, unspecified: Secondary | ICD-10-CM | POA: Diagnosis not present

## 2023-04-21 DIAGNOSIS — F4320 Adjustment disorder, unspecified: Secondary | ICD-10-CM | POA: Diagnosis not present

## 2023-05-26 DIAGNOSIS — F4320 Adjustment disorder, unspecified: Secondary | ICD-10-CM | POA: Diagnosis not present

## 2023-06-14 DIAGNOSIS — F4320 Adjustment disorder, unspecified: Secondary | ICD-10-CM | POA: Diagnosis not present

## 2023-12-08 IMAGING — US US OB < 14 WEEKS - US OB TV
1 series · 14 of 28 positions shown · non-contrast
Comparison: None.

CLINICAL DATA: Early pregnancy. Assess viability. Last menstrual
period 11/30/2021 (which corresponds to 7 weeks 1 day estimated
gestational age.



[Series 1: us ob < 14 weeks - us ob tv · 80 acquisitions, 14 frames shown]
[im 3/80]
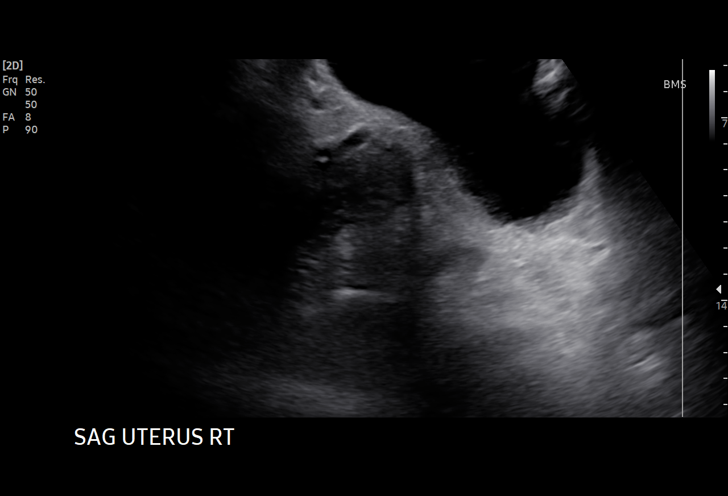
[im 9/80]
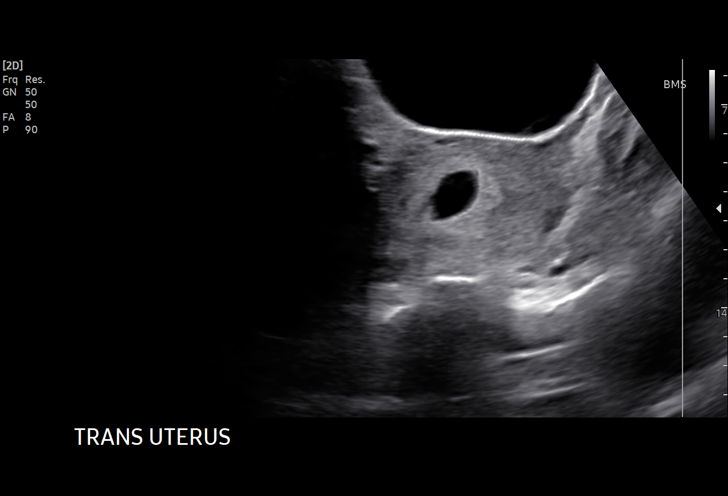
[im 15/80]
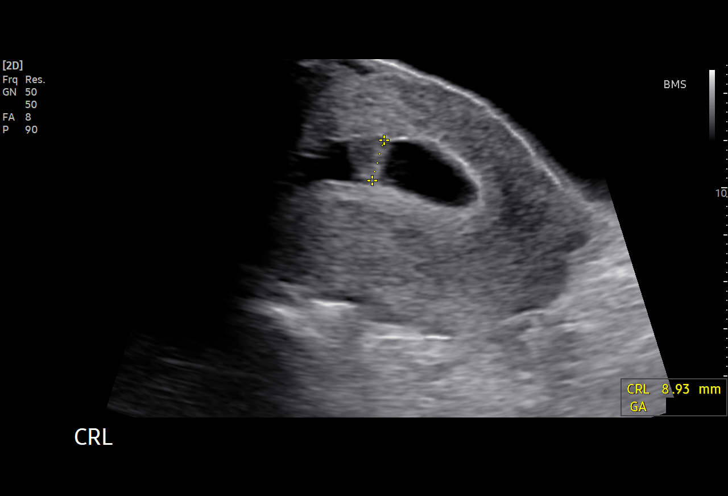
[im 21/80]
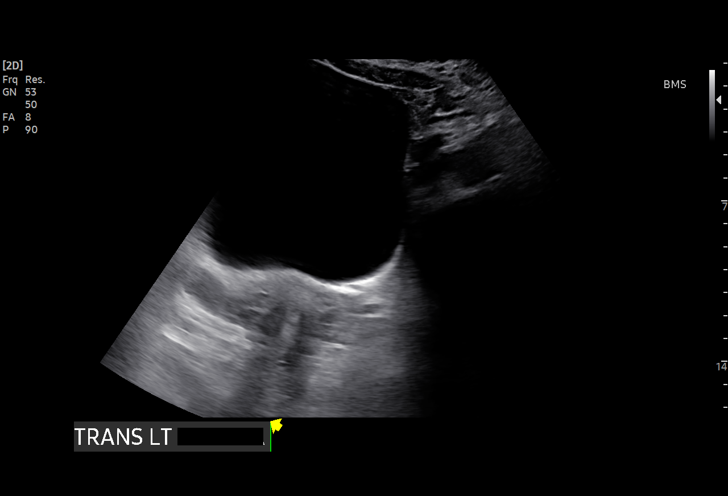
[im 27/80]
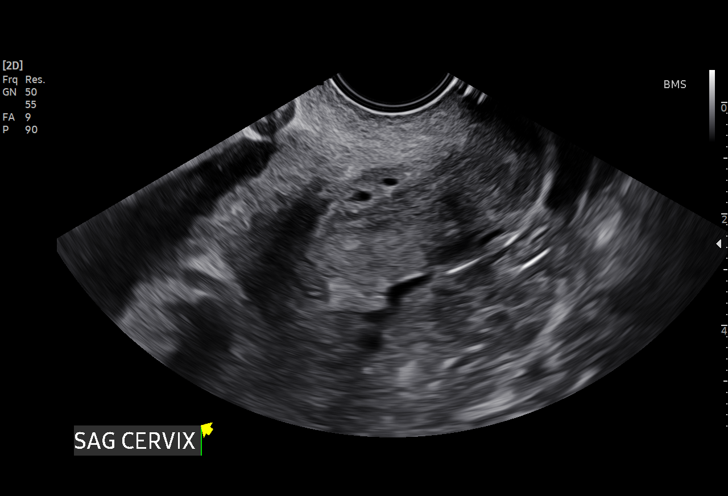
[im 33/80]
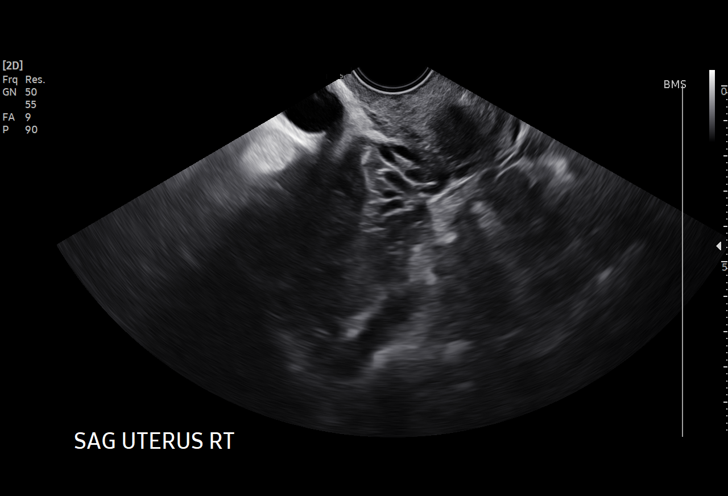
[im 39/80]
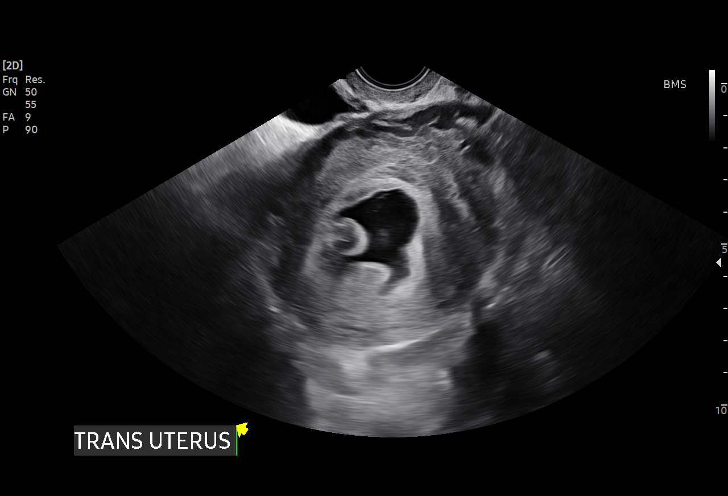
[im 44/80]
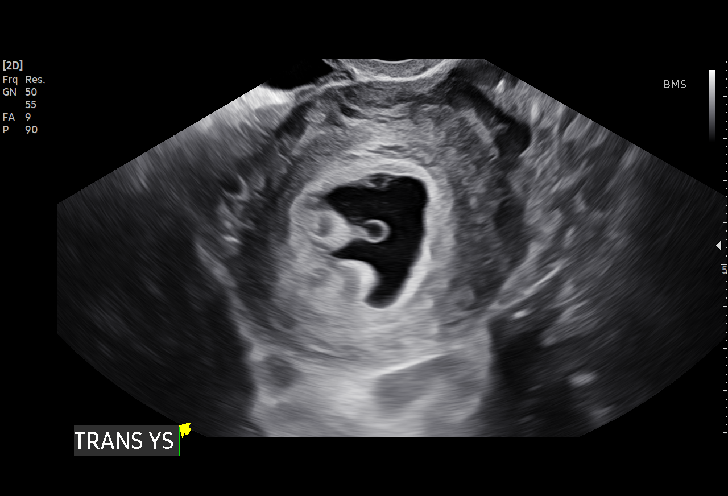
[im 50/80]
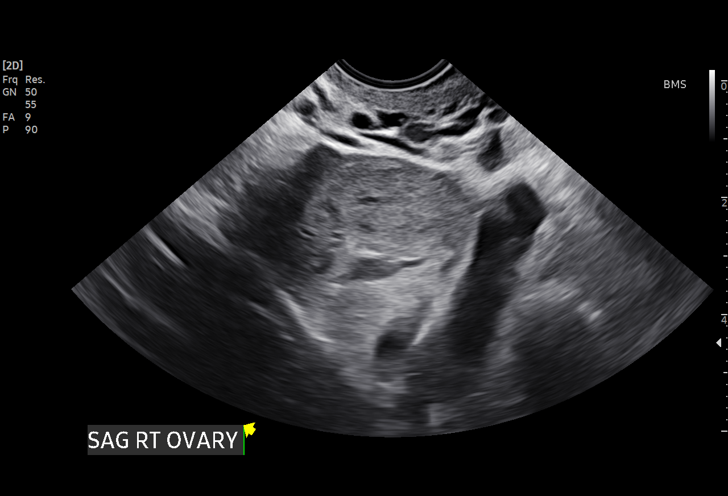
[im 56/80]
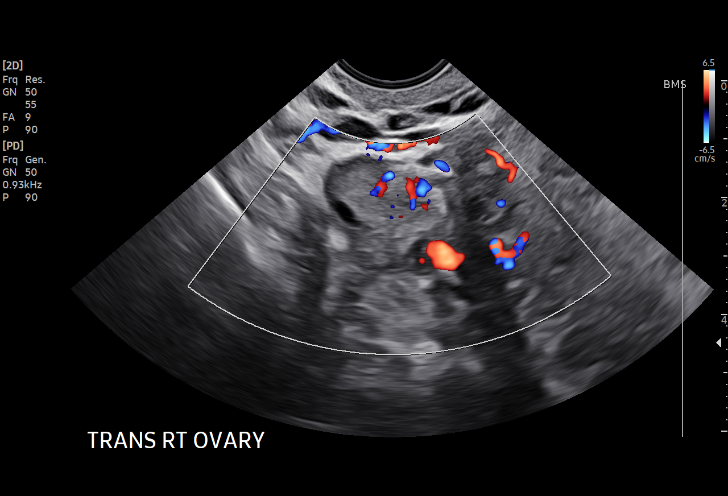
[im 62/80]
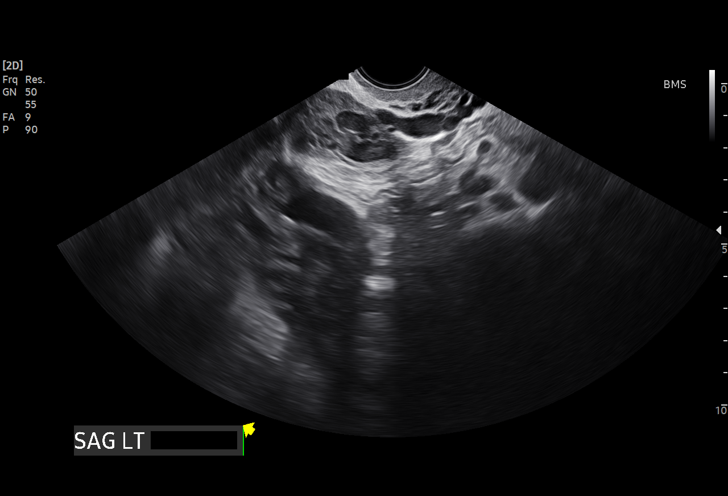
[im 68/80]
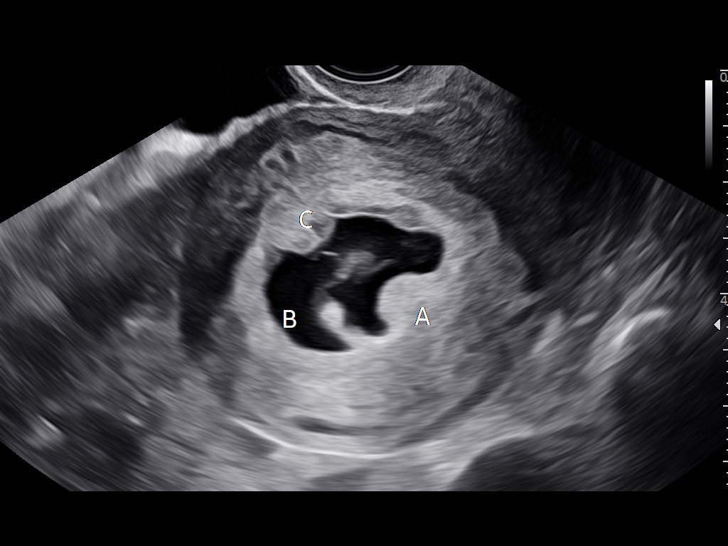
[im 74/80]
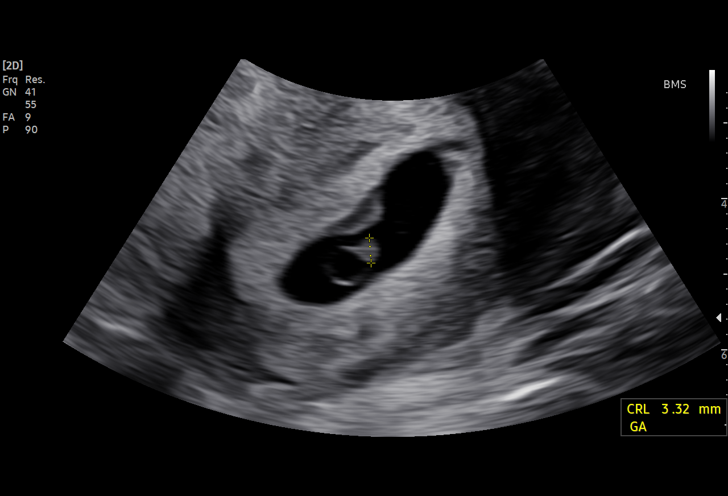
[im 80/80]
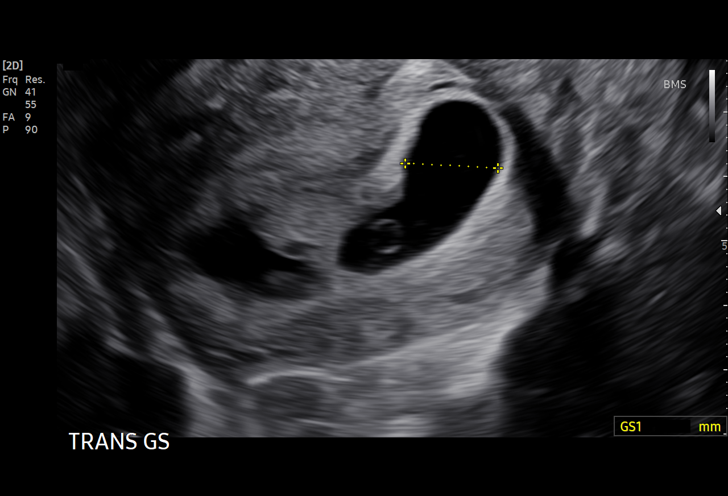

[14 of 28 positions shown; findings below may reference images not displayed]

FINDINGS: Intrauterine gestational sac: Single

Yolk sac:  Visualized.

Embryo:  Visualized.

Cardiac Activity: Visualized.

Heart Rate: 85 bpm

CRL:   3.6 mm   6 w 0 d                  US EDC: 09/14/2022

Subchorionic hemorrhage: None visualized. There are 3 oval-shaped
bulging regions from the chorionic wall extending into the
gestational sac measuring up to approximately 10 mm, 12 mm and 13 mm
respectively (see image 65/79), predominantly heterogeneous with
areas of increased echogenicity isoechoic to the chorion and two of
the three demonstrating hypoechoic centers which may represent
hematomas.

Maternal uterus/adnexae: Probable corpus luteum within the right
maternal ovary. The left maternal ovary was not visualized.

Pulsed Doppler evaluation of both ovaries demonstrates normal
appearing low-resistance arterial and venous waveforms.
IMPRESSION: :
IMPRESSION: 1. There is a single live intrauterine pregnancy. Fetal heart rate
measures 85 beats per minute which is lower than expected.
2. By crown-rump length, estimated gestational age is 6 weeks 0
days.
3. As described above, there are 3 oval-shaped bulging regions
extending into the gestational sac which are compatible with
"chorionic bumps." These are rare, and the limited literature on
this infrequent entity suggests that pregnancies with multiple
chronic bumps may be at increased risk for fetal demise. Recommend
continued attention on follow-up.

## 2023-12-16 IMAGING — US US OB TRANSVAGINAL
1 series · 15 of 26 positions shown · non-contrast
Comparison: January 19, 2022.

CLINICAL DATA: Bradycardia.

EXAM:
TRANSVAGINAL OB ULTRASOUND
TECHNIQUE: Transvaginal ultrasound was performed for complete evaluation of the
gestation as well as the maternal uterus, adnexal regions, and
pelvic cul-de-sac.

[Series 1: us ob transvaginal · 26 acquisitions, 15 frames shown]
[im 1/26]
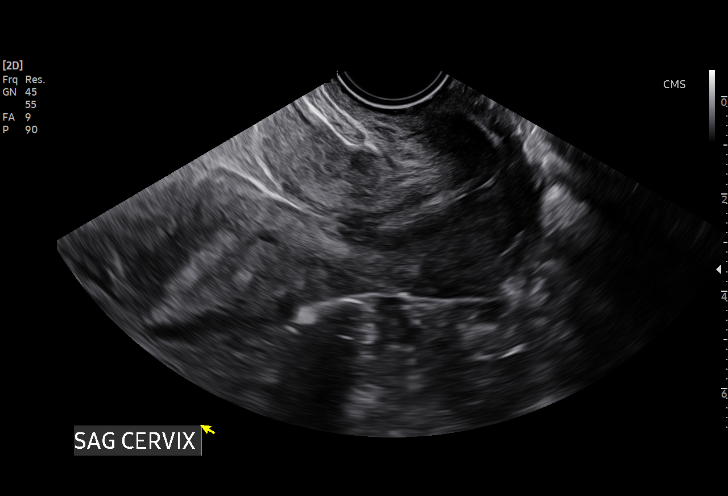
[im 3/26]
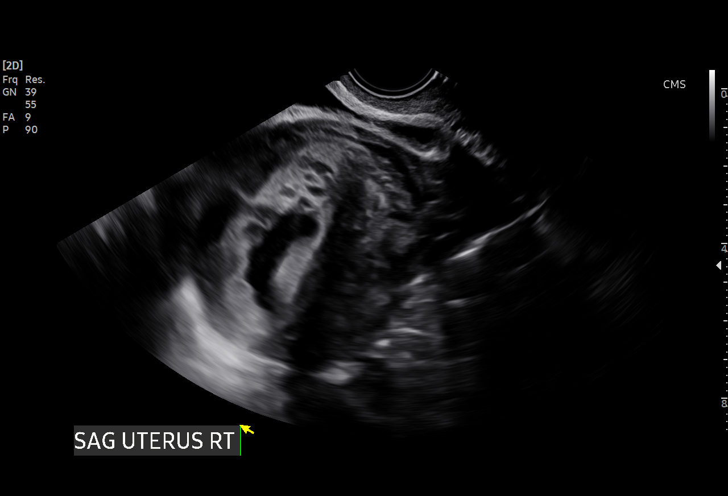
[im 5/26]
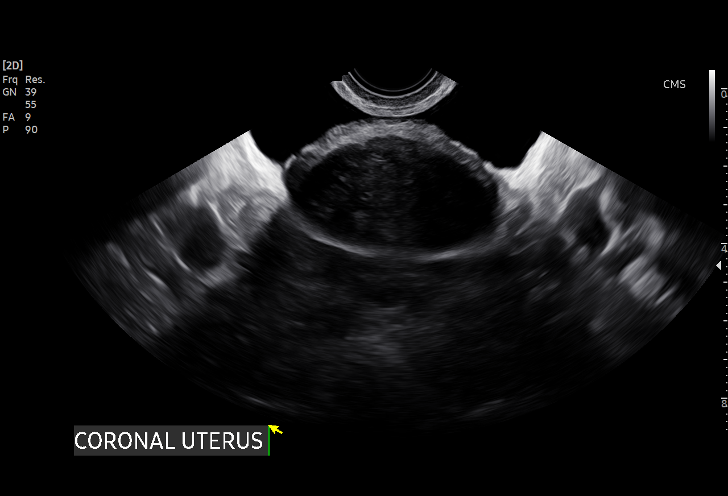
[im 7/26]
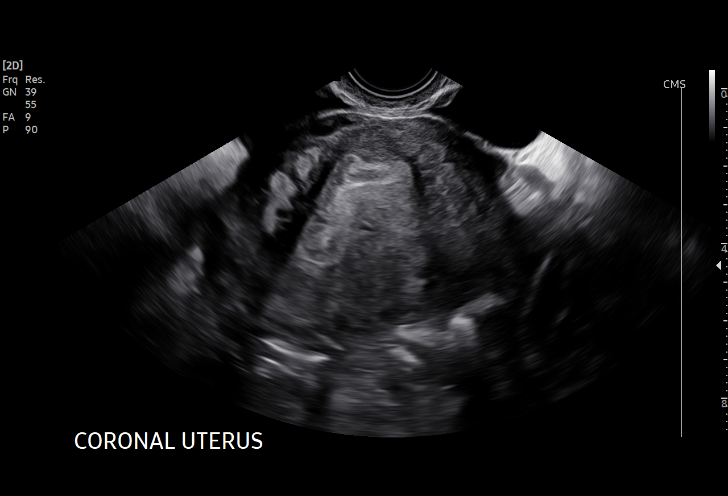
[im 8/26]
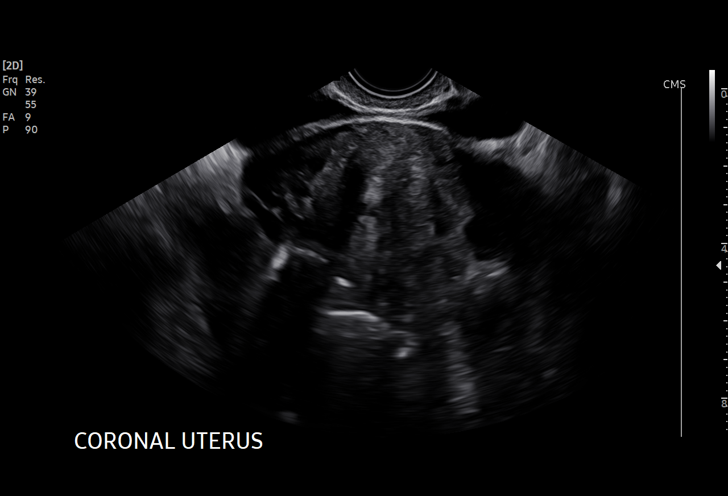
[im 10/26]
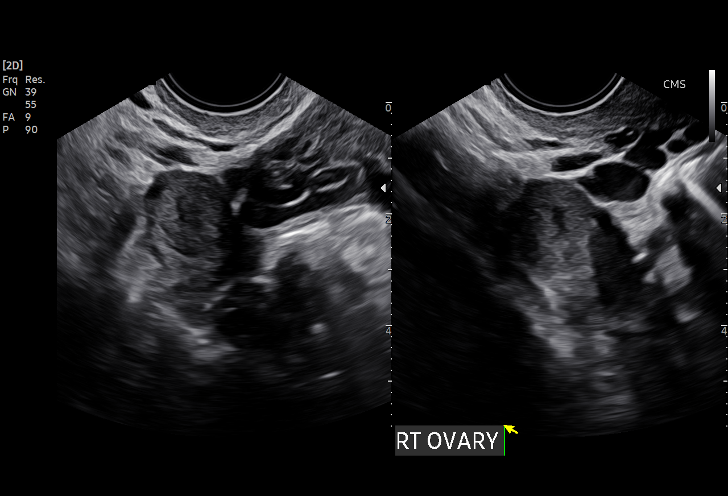
[im 12/26]
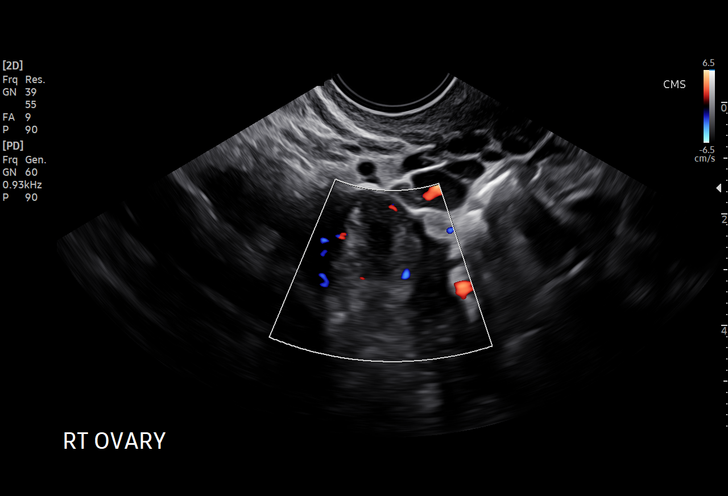
[im 14/26]
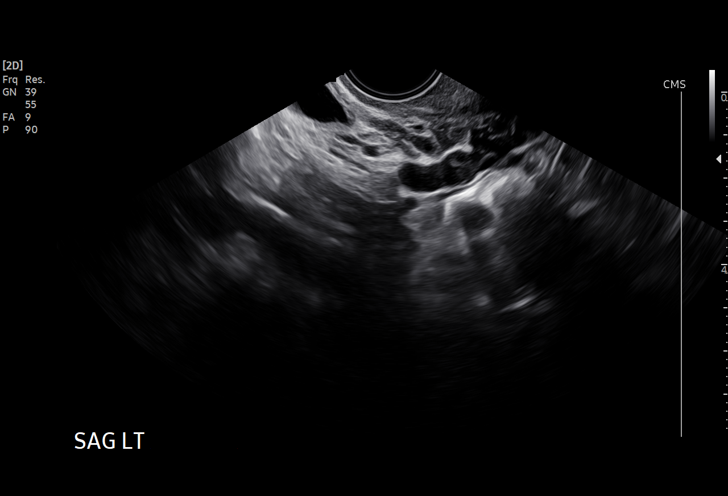
[im 15/26]
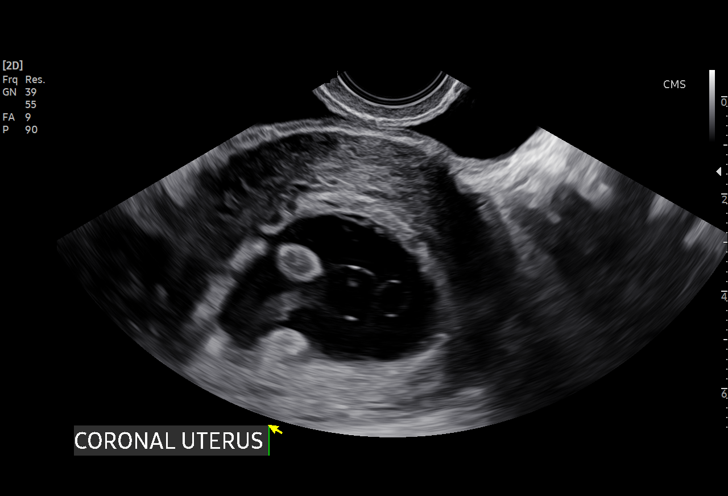
[im 17/26]
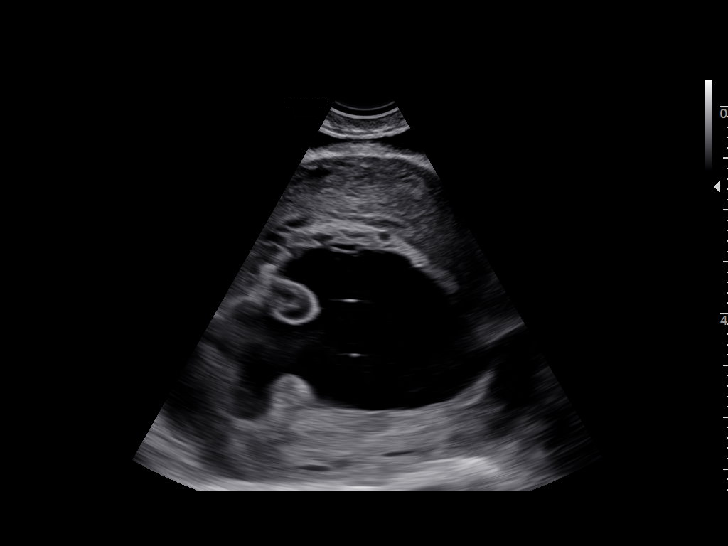
[im 19/26]
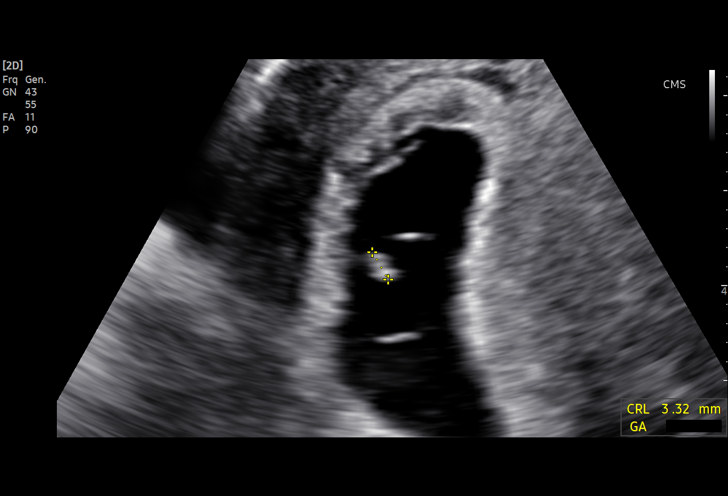
[im 20/26]
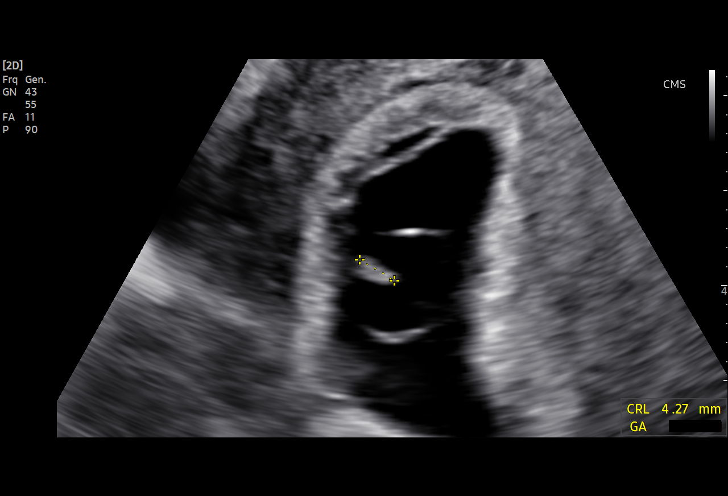
[im 22/26]
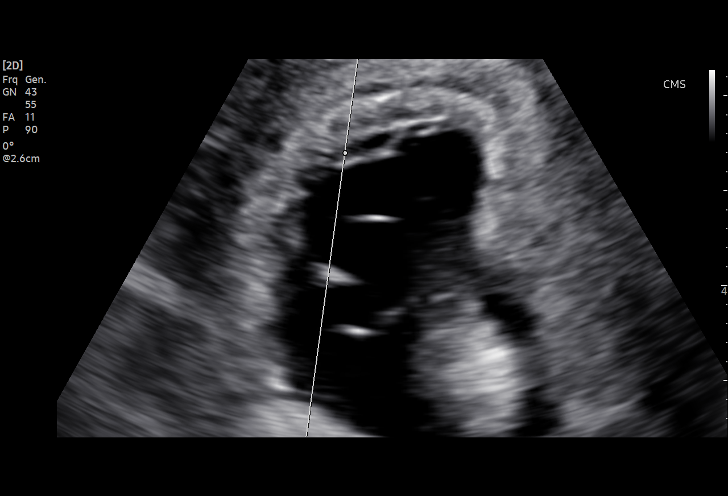
[im 24/26]
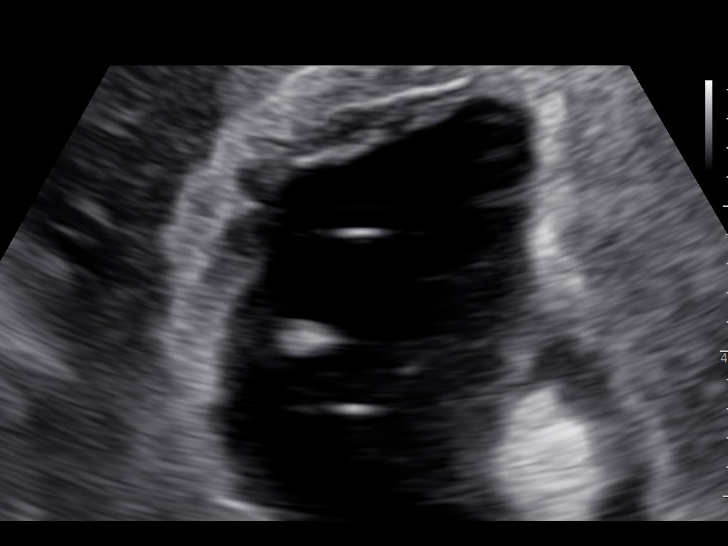
[im 26/26]
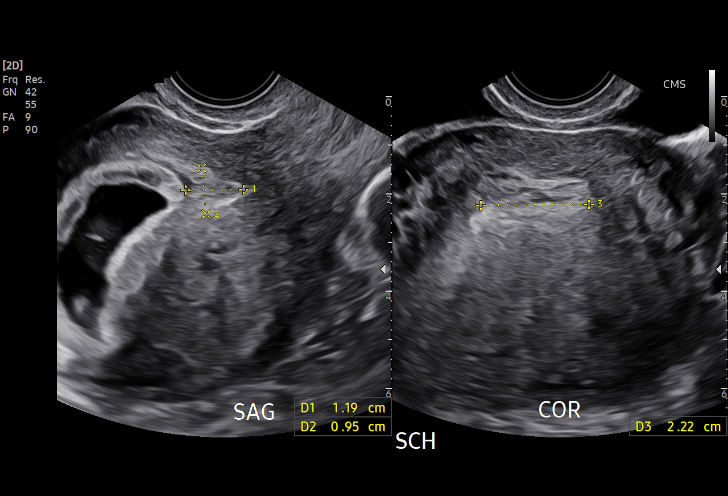

[15 of 26 positions shown; findings below may reference images not displayed]

FINDINGS: Intrauterine gestational sac: Single

Yolk sac:  Visualized.

Embryo:  Visualized.

Cardiac Activity: Not Visualized.

CRL:   4.1 mm   6 w 1 d                  US EDC: September 21, 2022.

Subchorionic hemorrhage:  Small subchorionic hemorrhage is noted.

Maternal uterus/adnexae: Right ovary appears normal. Left ovary is
not visualized. No free fluid is noted.
IMPRESSION: Fetal pole is noted corresponding to approximately 6 weeks and 1 day
of gestational age. Fetal heart rate noted on prior exam is not
noted currently. Findings are suspicious but not yet definitive for
failed pregnancy. Recommend follow-up US in 10-14 days for
definitive diagnosis. This recommendation follows SRU consensus
guidelines: Diagnostic Criteria for Nonviable Pregnancy Early in the
First Trimester. N Engl J Med 9015; [DATE].

## 2024-01-12 DIAGNOSIS — F4320 Adjustment disorder, unspecified: Secondary | ICD-10-CM | POA: Diagnosis not present

## 2024-02-02 DIAGNOSIS — F4320 Adjustment disorder, unspecified: Secondary | ICD-10-CM | POA: Diagnosis not present

## 2024-02-21 ENCOUNTER — Encounter: Payer: Self-pay | Admitting: Family Medicine

## 2024-02-21 ENCOUNTER — Ambulatory Visit: Payer: BC Managed Care – PPO | Admitting: Family Medicine

## 2024-02-21 VITALS — BP 122/74 | HR 88 | Temp 98.0°F | Ht 66.0 in | Wt 158.0 lb

## 2024-02-21 DIAGNOSIS — Z1231 Encounter for screening mammogram for malignant neoplasm of breast: Secondary | ICD-10-CM

## 2024-02-21 DIAGNOSIS — Z0000 Encounter for general adult medical examination without abnormal findings: Secondary | ICD-10-CM | POA: Diagnosis not present

## 2024-02-21 DIAGNOSIS — Z7689 Persons encountering health services in other specified circumstances: Secondary | ICD-10-CM

## 2024-02-21 DIAGNOSIS — Z13220 Encounter for screening for lipoid disorders: Secondary | ICD-10-CM | POA: Diagnosis not present

## 2024-02-21 DIAGNOSIS — Z1159 Encounter for screening for other viral diseases: Secondary | ICD-10-CM

## 2024-02-21 DIAGNOSIS — Z131 Encounter for screening for diabetes mellitus: Secondary | ICD-10-CM | POA: Diagnosis not present

## 2024-02-21 DIAGNOSIS — Z23 Encounter for immunization: Secondary | ICD-10-CM | POA: Diagnosis not present

## 2024-02-21 LAB — COMPREHENSIVE METABOLIC PANEL
ALT: 9 U/L (ref 0–35)
AST: 11 U/L (ref 0–37)
Albumin: 4.5 g/dL (ref 3.5–5.2)
Alkaline Phosphatase: 47 U/L (ref 39–117)
BUN: 20 mg/dL (ref 6–23)
CO2: 28 meq/L (ref 19–32)
Calcium: 9.4 mg/dL (ref 8.4–10.5)
Chloride: 103 meq/L (ref 96–112)
Creatinine, Ser: 0.92 mg/dL (ref 0.40–1.20)
GFR: 77.51 mL/min (ref >60.00)
Glucose, Bld: 88 mg/dL (ref 70–99)
Potassium: 4.3 meq/L (ref 3.5–5.1)
Sodium: 138 meq/L (ref 135–145)
Total Bilirubin: 0.4 mg/dL (ref 0.2–1.2)
Total Protein: 6.7 g/dL (ref 6.0–8.3)

## 2024-02-21 LAB — CBC
HCT: 40.9 % (ref 36.0–46.0)
Hemoglobin: 13.5 g/dL (ref 12.0–15.0)
MCHC: 33 g/dL (ref 30.0–36.0)
MCV: 92.6 fl (ref 78.0–100.0)
Platelets: 262 10*3/uL (ref 150.0–400.0)
RBC: 4.41 Mil/uL (ref 3.87–5.11)
RDW: 14.5 % (ref 11.5–15.5)
WBC: 7.2 10*3/uL (ref 4.0–10.5)

## 2024-02-21 NOTE — Patient Instructions (Addendum)
 Return in about 1 year (around 02/21/2025) for cpe (20 min).        Great to see you today.  I have refilled the medication(s) we provide.   If labs were collected or images ordered, we will inform you of  results once we have received them and reviewed. We will contact you either by echart message, or telephone call.  Please give ample time to the testing facility, and our office to run,  receive and review results. Please do not call inquiring of results, even if you can see them in your chart. We will contact you as soon as we are able. If it has been over 1 week since the test was completed, and you have not yet heard from [place], then please call us.    - echart message- for normal results that have been seen by the patient already.   - telephone call: abnormal results or if patient has not viewed results in their echart.  If a referral to a specialist was entered for you, please call us in 2 weeks if you have not heard from the specialist office to schedule.

## 2024-02-21 NOTE — Progress Notes (Signed)
 Patient ID: Charlotte Moore, female  DOB: 06-15-1981, 43 y.o.   MRN: [id number] Patient Care Team    Relationship Specialty Notifications Start End  Natalia Leatherwood, DO PCP - General Family Medicine  02/20/23   Milas Hock, MD Consulting Physician Obstetrics and Gynecology  02/21/24 02/21/24  Ranae Pila, MD Consulting Physician Obstetrics and Gynecology  02/21/24     Chief Complaint  Patient presents with   Annual Exam    Pt is not fasting.     Subjective:  Charlotte Moore is a 43 y.o.  female present for annual physical All past medical history, surgical history, allergies, family history, immunizations, medications and social history were updated in the electronic medical record today. All recent labs, ED visits and hospitalizations within the last year were reviewed.  Health maintenance:  Colon cancer screen: no fhx. Screen at 46 Mammogram: no fhx.:ordered [facility] again this year, did not have completed last year Cervical cancer screening: Had been established at [facility] for women's health care-requested records>referred Dr. Clemens Catholic Immunizations: tdap updated today 02/21/2024 , influenza declined (encouraged yearly) Infectious disease screening: HIV completed with pregnancy, hepatitis C completed today DEXA: routine screen to start around 34 to 42 years of age Assistive device: none Oxygen UXL:KGMW Patient has a Dental home. Hospitalizations/ED visits: Reviewed     02/21/2024    8:45 AM 02/20/2023    8:45 AM 06/17/2021   10:52 AM  Depression screen PHQ 2/9  Decreased Interest 0 0 0  Down, Depressed, Hopeless 0 1 1  PHQ - 2 Score 0 1 1  Altered sleeping 3  0  Tired, decreased energy 2  1  Change in appetite 0  0  Feeling bad or failure about yourself  0  0  Trouble concentrating 0  1  Moving slowly or fidgety/restless 0  0  Suicidal thoughts 0  0  PHQ-9 Score 5  3  Difficult doing work/chores Somewhat difficult        02/21/2024    8:45 AM  02/20/2023    8:45 AM 06/17/2021   10:53 AM  GAD 7 : Generalized Anxiety Score  Nervous, Anxious, on Edge 1 1 1   Control/stop worrying 0 1 1  Worry too much - different things 1 1 1   Trouble relaxing 1 1 1   Restless 1 1 0  Easily annoyed or irritable 0 1 1  Afraid - awful might happen 0 0 0  Total GAD 7 Score 4 6 5   Anxiety Difficulty Not difficult at all             02/21/2024    8:45 AM 02/20/2023    8:45 AM  Fall Risk   Falls in the past year? 0 0  Injury with Fall?  0  Follow up Falls evaluation completed Falls evaluation completed     Immunization History  Administered Date(s) Administered   Tdap 02/21/2024    No results found.  Past Medical History:  Diagnosis Date   Chicken pox    Miscarriage 02/23/2022   S/P ectopic pregnancy 05/30/2021   S/p left salpingectomy   No Known Allergies Past Surgical History:  Procedure Laterality Date   DIAGNOSTIC LAPAROSCOPY WITH REMOVAL OF ECTOPIC PREGNANCY N/A 05/30/2021   Procedure: DIAGNOSTIC LAPAROSCOPY WITH REMOVAL OF ECTOPIC PREGNANCY;  Surgeon: Reva Bores, MD;  Location: [facility] OR;  Service: Gynecology;  Laterality: N/A;   Family History  Problem Relation Age of Onset   Miscarriages / [place] Mother  Depression Mother    Kidney disease Father    Intellectual disability Father    Asthma Neg Hx    Diabetes Neg Hx    Heart disease Neg Hx    Hypertension Neg Hx    Stroke Neg Hx    Social History   Social History Narrative   Marital status/children/pets: Married, 1 child   Education/employment: Garment/textile technologist, employed as a Youth worker:      -smoke alarm in the home:Yes     - wears seatbelt: Yes     - Feels safe in their relationships: Yes       Allergies as of 02/21/2024   No Known Allergies      Medication List    as of February 21, 2024  8:58 AM   You have not been prescribed any medications.     All past medical history, surgical history, allergies, family history, immunizations  andmedications were updated in the EMR today and reviewed under the history and medication portions of their EMR.    No results found for this or any previous visit (from the past 2160 hours).   ROS 14 pt review of systems performed and negative (unless mentioned in an HPI)  Objective: BP 122/74   Pulse 88   Temp 98 F (36.7 C)   Ht 5\' 6"  (1.676 m)   Wt 158 lb (71.7 kg)   LMP 02/05/2024   SpO2 99%   BMI 25.50 kg/m  Physical Exam Vitals and nursing note reviewed.  Constitutional:      General: She is not in acute distress.    Appearance: Normal appearance. She is not ill-appearing or toxic-appearing.  HENT:     Head: Normocephalic and atraumatic.     Right Ear: Tympanic membrane, ear Moore and external ear normal. There is no impacted cerumen.     Left Ear: Tympanic membrane, ear Moore and external ear normal. There is no impacted cerumen.     Nose: No congestion or rhinorrhea.     Mouth/Throat:     Mouth: Mucous membranes are moist.     Pharynx: Oropharynx is clear. No oropharyngeal exudate or posterior oropharyngeal erythema.  Eyes:     General: No scleral icterus.       Right eye: No discharge.        Left eye: No discharge.     Extraocular Movements: Extraocular movements intact.     Conjunctiva/sclera: Conjunctivae normal.     Pupils: Pupils are equal, round, and reactive to light.  Cardiovascular:     Rate and Rhythm: Normal rate and regular rhythm.     Pulses: Normal pulses.     Heart sounds: Normal heart sounds. No murmur heard.    No friction rub. No gallop.  Pulmonary:     Effort: Pulmonary effort is normal. No respiratory distress.     Breath sounds: Normal breath sounds. No stridor. No wheezing, rhonchi or rales.  Chest:     Chest wall: No tenderness.  Abdominal:     General: Abdomen is flat. Bowel sounds are normal. There is no distension.     Palpations: Abdomen is soft. There is no mass.     Tenderness: There is no abdominal tenderness. There is no  right CVA tenderness, left CVA tenderness, guarding or rebound.     Hernia: No hernia is present.  Musculoskeletal:        General: No swelling, tenderness or deformity. Normal range of motion.     Cervical  back: Normal range of motion and neck supple. No rigidity or tenderness.     Right lower leg: No edema.     Left lower leg: No edema.  Lymphadenopathy:     Cervical: No cervical adenopathy.  Skin:    General: Skin is warm and dry.     Coloration: Skin is not jaundiced or pale.     Findings: No bruising, erythema, lesion or rash.  Neurological:     General: No focal deficit present.     Mental Status: She is alert and oriented to person, place, and time. Mental status is at baseline.     Cranial Nerves: No cranial nerve deficit.     Sensory: No sensory deficit.     Motor: No weakness.     Coordination: Coordination normal.     Gait: Gait normal.     Deep Tendon Reflexes: Reflexes normal.  Psychiatric:        Mood and Affect: Mood normal.        Behavior: Behavior normal.        Thought Content: Thought content normal.        Judgment: Judgment normal.      Assessment/plan: Charlotte Moore is a 43 y.o. female present for cpe Routine general medical examination at a health care facility Patient was encouraged to exercise greater than 150 minutes a week. Patient was encouraged to choose a diet filled with fresh fruits and vegetables, and lean meats. AVS provided to patient today for education/recommendation on gender specific health and safety maintenance. Colon cancer screen: no fhx. Screen at 46 Mammogram: no fhx.:ordered [facility] again this year, did not have completed last year Cervical cancer screening: Had been established at [facility] for women's health care-requested records>referred Dr. Clemens Catholic Immunizations: tdap updated today 02/21/2024 , influenza declined (encouraged yearly) Infectious disease screening: HIV completed with pregnancy, hepatitis C completed  today DEXA: routine screen to start around 65 to 42 years of age    Return in about 1 year (around 02/21/2025) for cpe (20 min).  Orders Placed This Encounter  Procedures   MM 3D SCREENING MAMMOGRAM BILATERAL BREAST   Tdap vaccine greater than or equal to 7yo IM   CBC   Comprehensive metabolic panel   TSH   Hepatitis C Antibody   Ambulatory referral to Gynecology   No orders of the defined types were placed in this encounter.  Referral Orders         Ambulatory referral to Gynecology       Note is dictated utilizing voice recognition software. Although note has been proof read prior to signing, occasional typographical errors still can be missed. If any questions arise, please do not hesitate to call for verification.  Electronically signed by: Felix Pacini, DO [facility] [place]

## 2024-02-22 LAB — HEPATITIS C ANTIBODY: Hepatitis C Ab: NONREACTIVE

## 2024-02-22 LAB — TSH: TSH: 0.53 u[IU]/mL (ref 0.35–5.50)

## 2024-02-29 ENCOUNTER — Ambulatory Visit
Admission: RE | Admit: 2024-02-29 | Discharge: 2024-02-29 | Disposition: A | Discharge status: Home / Self Care | Source: Ambulatory Visit | Attending: Family Medicine | Admitting: Family Medicine

## 2024-02-29 DIAGNOSIS — Z1231 Encounter for screening mammogram for malignant neoplasm of breast: Secondary | ICD-10-CM | POA: Diagnosis not present

## 2024-03-04 ENCOUNTER — Other Ambulatory Visit: Payer: Self-pay | Admitting: Family Medicine

## 2024-03-04 DIAGNOSIS — R928 Other abnormal and inconclusive findings on diagnostic imaging of breast: Secondary | ICD-10-CM

## 2024-03-08 DIAGNOSIS — F4320 Adjustment disorder, unspecified: Secondary | ICD-10-CM | POA: Diagnosis not present

## 2024-03-15 DIAGNOSIS — F4320 Adjustment disorder, unspecified: Secondary | ICD-10-CM | POA: Diagnosis not present

## 2024-03-18 ENCOUNTER — Ambulatory Visit
Admission: RE | Admit: 2024-03-18 | Discharge: 2024-03-18 | Disposition: A | Discharge status: Home / Self Care | Source: Ambulatory Visit | Attending: Family Medicine | Admitting: Family Medicine

## 2024-03-18 DIAGNOSIS — N6002 Solitary cyst of left breast: Secondary | ICD-10-CM | POA: Diagnosis not present

## 2024-03-18 DIAGNOSIS — R928 Other abnormal and inconclusive findings on diagnostic imaging of breast: Secondary | ICD-10-CM

## 2024-03-21 DIAGNOSIS — Z1151 Encounter for screening for human papillomavirus (HPV): Secondary | ICD-10-CM | POA: Diagnosis not present

## 2024-03-21 DIAGNOSIS — Z124 Encounter for screening for malignant neoplasm of cervix: Secondary | ICD-10-CM | POA: Diagnosis not present

## 2024-03-21 DIAGNOSIS — Z01419 Encounter for gynecological examination (general) (routine) without abnormal findings: Secondary | ICD-10-CM | POA: Diagnosis not present

## 2024-03-21 DIAGNOSIS — Z6825 Body mass index (BMI) 25.0-25.9, adult: Secondary | ICD-10-CM | POA: Diagnosis not present

## 2024-04-12 DIAGNOSIS — F4320 Adjustment disorder, unspecified: Secondary | ICD-10-CM | POA: Diagnosis not present

## 2024-05-03 DIAGNOSIS — F4320 Adjustment disorder, unspecified: Secondary | ICD-10-CM | POA: Diagnosis not present

## 2024-05-24 DIAGNOSIS — F4323 Adjustment disorder with mixed anxiety and depressed mood: Secondary | ICD-10-CM | POA: Diagnosis not present

## 2025-02-21 ENCOUNTER — Encounter: Admitting: Family Medicine
